# Patient Record
Sex: Female | Born: 1970
Health system: Southern US, Community
[De-identification: ages and names within clinical notes are randomized; demographics above are authoritative.]

## PROBLEM LIST (undated history)

## (undated) DIAGNOSIS — M1990 Unspecified osteoarthritis, unspecified site: Secondary | ICD-10-CM

## (undated) DIAGNOSIS — G5603 Carpal tunnel syndrome, bilateral upper limbs: Secondary | ICD-10-CM

## (undated) DIAGNOSIS — M549 Dorsalgia, unspecified: Secondary | ICD-10-CM

## (undated) DIAGNOSIS — R079 Chest pain, unspecified: Secondary | ICD-10-CM

## (undated) HISTORY — PX: OVARIAN CYST REMOVAL: SHX89

## (undated) HISTORY — PX: APPENDECTOMY: SHX54

## (undated) HISTORY — PX: DENTAL SURGERY: SHX609

## (undated) HISTORY — PX: UPPER GI ENDOSCOPY: SHX6162

## (undated) HISTORY — DX: Chest pain, unspecified: R07.9

## (undated) HISTORY — PX: LASIK: SHX215

---

## 2000-10-24 ENCOUNTER — Encounter: Payer: Self-pay | Admitting: Family Medicine

## 2000-10-24 ENCOUNTER — Encounter: Admission: RE | Admit: 2000-10-24 | Discharge: 2000-10-24 | Payer: Self-pay | Admitting: Family Medicine

## 2003-04-27 ENCOUNTER — Other Ambulatory Visit: Admission: RE | Admit: 2003-04-27 | Discharge: 2003-04-27 | Payer: Self-pay | Admitting: Family Medicine

## 2004-05-02 ENCOUNTER — Other Ambulatory Visit: Admission: RE | Admit: 2004-05-02 | Discharge: 2004-05-02 | Payer: Self-pay | Admitting: Family Medicine

## 2005-05-09 ENCOUNTER — Ambulatory Visit (HOSPITAL_COMMUNITY): Admission: RE | Admit: 2005-05-09 | Discharge: 2005-05-09 | Payer: Self-pay | Admitting: Obstetrics and Gynecology

## 2005-06-01 ENCOUNTER — Inpatient Hospital Stay (HOSPITAL_COMMUNITY): Admission: AD | Admit: 2005-06-01 | Discharge: 2005-06-03 | Payer: Self-pay | Admitting: Obstetrics and Gynecology

## 2007-02-10 ENCOUNTER — Other Ambulatory Visit: Admission: RE | Admit: 2007-02-10 | Discharge: 2007-02-10 | Payer: Self-pay | Admitting: Family Medicine

## 2008-02-11 ENCOUNTER — Other Ambulatory Visit: Admission: RE | Admit: 2008-02-11 | Discharge: 2008-02-11 | Payer: Self-pay | Admitting: Family Medicine

## 2008-02-13 ENCOUNTER — Ambulatory Visit (HOSPITAL_BASED_OUTPATIENT_CLINIC_OR_DEPARTMENT_OTHER): Admission: RE | Admit: 2008-02-13 | Discharge: 2008-02-13 | Payer: Self-pay | Admitting: Family Medicine

## 2008-02-13 ENCOUNTER — Ambulatory Visit: Payer: Self-pay | Admitting: Diagnostic Radiology

## 2010-06-09 NOTE — Op Note (Signed)
Brittany Massey, Brittany Massey                 ACCOUNT NO.:  [id number]   MEDICAL RECORD NO.:  [id number]          PATIENT TYPE:  INP   LOCATION:  9146                          FACILITY:  [facility]   PHYSICIAN:  Lenoard Aden, M.D.DATE OF BIRTH:  11/16/1970   DATE OF PROCEDURE:  06/01/2005  DATE OF DISCHARGE:                                 OPERATIVE REPORT   PREOPERATIVE DIAGNOSES:  1.  A 39 week intrauterine pregnancy.  2.  Estimated fetal weight greater than 4800 g.   POSTOPERATIVE DIAGNOSES:  1.  A 39 week intrauterine pregnancy.  2.  Estimated fetal weight greater than 4800 g.   PROCEDURE:  Primary low segment transverse cesarean section.   SURGEON:  Dr. Billy Coast   ASSISTANT:  Fredric Mare, CNM   ANESTHESIA:  Spinal by Maudry Diego BLOOD LOSS:  1000 mL.   COMPLICATIONS:  None.   DRAINS:  Foley.   COUNTS:  Correct.  The patient in recovery in good condition.   BRIEF OPERATIVE NOTE:  After being apprised of the risks of anesthesia,  infection, bleeding, injury to abdominal organs, need for repair,  __________complications to include bowel or bladder are noted.  The patient  brought to the operating room where she is administered spinal anesthetic  without complications, prepped and draped in the usual sterile fashion,  Foley catheter placed.  After achieving adequate anesthesia, dilute Marcaine  solution placed in the area of a previously made Pfannenstiel skin incision  then made with a scalpel after placement of a dilute Marcaine solution.  The  incision is carried down to the fascia in the midline which is opened  transversely using Mayo scissors.  Rectus muscle is dissected sharply in the  midline, peritoneum entered sharply, bladder blade placed.  Visceroperitoneum is scored sharply in the lower uterine segment.  Kerr  hysterotomy incision made, atraumatic delivery using vacuum system, 1 pull,  for a living female, 9 pounds, 9 ounces, delivered from an occiput anterior  position and handed to pediatricians in attendance.  Apgars 8 and 9, cord  blood collected, placenta delivered manually intact, three-vessel cord  noted.  Uterus exteriorized using a dry lap pack and closed in 2 layers  using a 0 Monocryl suture in a continuous running fashion.  Normal tubes,  normal ovaries noted.  Irrigation is accomplished.  Interrupted sutures  placed along the left lateral portion of the incision to obtain hemostasis.  Good hemostasis is then  achieved, bladder flap inspected and found to be hemostatic.  The fascia  closed using a 0 Monocryl in continuous running fashion.  Skin closed using  staples, 0 plain suture placed in the subcutaneous tissue for approximation  of dead space.  The patient tolerates the procedure well and is transferred  to recovery in good condition.      Lenoard Aden, M.D.  Electronically Signed     RJT/MEDQ  D:  06/01/2005  T:  06/02/2005  Job:  [id number]

## 2010-06-09 NOTE — Discharge Summary (Signed)
NAMEDESHA, BITNER NO.:  [id number]   MEDICAL RECORD NO.:  [id number]          PATIENT TYPE:  INP   LOCATION:  9146                          FACILITY:  [facility]   PHYSICIAN:  Lenoard Aden, M.D.DATE OF BIRTH:  19-Feb-1970   DATE OF ADMISSION:  06/01/2005  DATE OF DISCHARGE:  06/03/2005                                 DISCHARGE SUMMARY   Patient underwent uncomplicated cesarean section, Jun 01, 2005.  Postoperative course uncomplicated.  Hemoglobin and hematocrit within normal  limits.  Discharge teaching done.  Follow-up in the office in four to six  weeks.  Discharge medications will include prenatal vitamins, iron and  Percocet.      Lenoard Aden, M.D.  Electronically Signed     RJT/MEDQ  D:  08/14/2005  T:  08/15/2005  Job:  [id number]

## 2015-12-13 ENCOUNTER — Other Ambulatory Visit (HOSPITAL_BASED_OUTPATIENT_CLINIC_OR_DEPARTMENT_OTHER): Payer: Self-pay | Admitting: Obstetrics and Gynecology

## 2015-12-13 DIAGNOSIS — Z1231 Encounter for screening mammogram for malignant neoplasm of breast: Secondary | ICD-10-CM

## 2015-12-19 ENCOUNTER — Encounter (HOSPITAL_BASED_OUTPATIENT_CLINIC_OR_DEPARTMENT_OTHER): Payer: Self-pay | Admitting: Radiology

## 2015-12-19 ENCOUNTER — Ambulatory Visit (HOSPITAL_BASED_OUTPATIENT_CLINIC_OR_DEPARTMENT_OTHER)
Admission: RE | Admit: 2015-12-19 | Discharge: 2015-12-19 | Disposition: A | Discharge status: Home / Self Care | Payer: BLUE CROSS/BLUE SHIELD | Source: Ambulatory Visit | Attending: Obstetrics and Gynecology | Admitting: Obstetrics and Gynecology

## 2015-12-19 DIAGNOSIS — Z1231 Encounter for screening mammogram for malignant neoplasm of breast: Secondary | ICD-10-CM | POA: Diagnosis not present

## 2015-12-19 DIAGNOSIS — Z1151 Encounter for screening for human papillomavirus (HPV): Secondary | ICD-10-CM | POA: Diagnosis not present

## 2015-12-19 DIAGNOSIS — N924 Excessive bleeding in the premenopausal period: Secondary | ICD-10-CM | POA: Diagnosis not present

## 2015-12-19 DIAGNOSIS — Z01419 Encounter for gynecological examination (general) (routine) without abnormal findings: Secondary | ICD-10-CM | POA: Diagnosis not present

## 2016-06-19 DIAGNOSIS — D2272 Melanocytic nevi of left lower limb, including hip: Secondary | ICD-10-CM | POA: Diagnosis not present

## 2016-06-19 DIAGNOSIS — Z1283 Encounter for screening for malignant neoplasm of skin: Secondary | ICD-10-CM | POA: Diagnosis not present

## 2016-06-19 DIAGNOSIS — L821 Other seborrheic keratosis: Secondary | ICD-10-CM | POA: Diagnosis not present

## 2016-06-19 DIAGNOSIS — D485 Neoplasm of uncertain behavior of skin: Secondary | ICD-10-CM | POA: Diagnosis not present

## 2016-10-24 DIAGNOSIS — D2371 Other benign neoplasm of skin of right lower limb, including hip: Secondary | ICD-10-CM | POA: Diagnosis not present

## 2016-10-24 DIAGNOSIS — D2372 Other benign neoplasm of skin of left lower limb, including hip: Secondary | ICD-10-CM | POA: Diagnosis not present

## 2017-10-08 ENCOUNTER — Telehealth: Payer: Self-pay | Admitting: *Deleted

## 2017-10-08 NOTE — Telephone Encounter (Signed)
Wellness forms completed,faxed,confirmation received and scanned into chart.Brittany Massey, [name]

## 2017-10-15 ENCOUNTER — Encounter (HOSPITAL_BASED_OUTPATIENT_CLINIC_OR_DEPARTMENT_OTHER): Payer: Self-pay | Admitting: Emergency Medicine

## 2017-10-15 ENCOUNTER — Other Ambulatory Visit: Payer: Self-pay

## 2017-10-15 ENCOUNTER — Emergency Department (HOSPITAL_BASED_OUTPATIENT_CLINIC_OR_DEPARTMENT_OTHER): Payer: BLUE CROSS/BLUE SHIELD

## 2017-10-15 ENCOUNTER — Emergency Department (HOSPITAL_BASED_OUTPATIENT_CLINIC_OR_DEPARTMENT_OTHER)
Admission: EM | Admit: 2017-10-15 | Discharge: 2017-10-15 | Disposition: A | Discharge status: Home / Self Care | Payer: BLUE CROSS/BLUE SHIELD | Attending: Emergency Medicine | Admitting: Emergency Medicine

## 2017-10-15 DIAGNOSIS — R079 Chest pain, unspecified: Secondary | ICD-10-CM | POA: Diagnosis not present

## 2017-10-15 DIAGNOSIS — F17210 Nicotine dependence, cigarettes, uncomplicated: Secondary | ICD-10-CM | POA: Diagnosis not present

## 2017-10-15 DIAGNOSIS — R072 Precordial pain: Secondary | ICD-10-CM

## 2017-10-15 LAB — CBC
HEMATOCRIT: 46.3 % — AB (ref 36.0–46.0)
HEMOGLOBIN: 16 g/dL — AB (ref 12.0–15.0)
MCH: 33.2 pg (ref 26.0–34.0)
MCHC: 34.6 g/dL (ref 30.0–36.0)
MCV: 96.1 fL (ref 78.0–100.0)
Platelets: 219 10*3/uL (ref 150–400)
RBC: 4.82 MIL/uL (ref 3.87–5.11)
RDW: 11.6 % (ref 11.5–15.5)
WBC: 7.4 10*3/uL (ref 4.0–10.5)

## 2017-10-15 LAB — BASIC METABOLIC PANEL
ANION GAP: 12 (ref 5–15)
BUN: 12 mg/dL (ref 6–20)
CHLORIDE: 102 mmol/L (ref 98–111)
CO2: 24 mmol/L (ref 22–32)
Calcium: 8.8 mg/dL — ABNORMAL LOW (ref 8.9–10.3)
Creatinine, Ser: 0.67 mg/dL (ref 0.44–1.00)
Glucose, Bld: 118 mg/dL — ABNORMAL HIGH (ref 70–99)
POTASSIUM: 3.5 mmol/L (ref 3.5–5.1)
SODIUM: 138 mmol/L (ref 135–145)

## 2017-10-15 LAB — TROPONIN I
Troponin I: <0.03 ng/mL (ref <0.03)
Troponin I: <0.03 ng/mL (ref <0.03)

## 2017-10-15 LAB — PREGNANCY, URINE: PREG TEST UR: NEGATIVE

## 2017-10-15 MED ORDER — OMEPRAZOLE 20 MG PO CPDR: 20.0000 mg | DELAYED_RELEASE_CAPSULE | Freq: Every day | ORAL | 0 refills | Status: DC

## 2017-10-15 MED FILL — OMEPRAZOLE 20 MG CPDR: 20 | 30 days supply | Qty: 30 | Fill #0

## 2017-10-15 NOTE — ED Provider Notes (Signed)
[facility] Provider Note   CSN: [id number] Arrival date & time: 10/15/17  6063     History   Chief Complaint Chief Complaint  Patient presents with  . Chest Pain    HPI Brittany Massey is a 47 y.o. female.  HPI 47 year old female presents the emergency department with complaints of central chest discomfort that began approximately 530 this morning.  This occurred while working out.  She had a similar episode 2 weeks ago but at that time it was not associated with exertion.  She has had no chest discomfort between the episode 2 weeks ago and the episode today.  She worked out yesterday with vigor and had no difficulty.  She denied chest discomfort with exertion yesterday.  No family history of early cardiac disease.  She denies a history of hypertension, hyperlipidemia, diabetes.  She has a prior history of tobacco abuse but quit 12 years ago.  Patient reports chest discomfort is resolving at this time without intervention through the emergency department.  No unilateral leg swelling.  No pain with deep breathing.  No shortness of breath.  No nausea or vomiting or diaphoresis   History reviewed. No pertinent past medical history.  There are no active problems to display for this patient.   Past Surgical History:  Procedure Laterality Date  . APPENDECTOMY    . CESAREAN SECTION       OB History   None      Home Medications    Prior to Admission medications   Not on File    Family History History reviewed. No pertinent family history.  Social History Social History   Tobacco Use  . Smoking status: Current Every Day Smoker    Packs/day: 1.00    Types: Cigarettes  . Smokeless tobacco: Never Used  Substance Use Topics  . Alcohol use: Yes  . Drug use: Never     Allergies   Penicillins; Sulfa antibiotics; and Wellbutrin [bupropion]   Review of Systems Review of Systems  All other systems reviewed and are  negative.    Physical Exam Updated Vital Signs BP (!) 158/82   Pulse 65   Temp 98.3 F (36.8 C) (Oral)   Resp 14   Ht 5\' 4"  (1.626 m)   Wt 72.6 kg   LMP 09/16/2017   SpO2 100%   BMI 27.46 kg/m   Physical Exam  Constitutional: She is oriented to person, place, and time. She appears well-developed and well-nourished. No distress.  HENT:  Head: Normocephalic and atraumatic.  Eyes: EOM are normal.  Neck: Normal range of motion.  Cardiovascular: Normal rate, regular rhythm and normal heart sounds.  Pulmonary/Chest: Effort normal and breath sounds normal.  Abdominal: Soft. She exhibits no distension. There is no tenderness.  Musculoskeletal: Normal range of motion.  Neurological: She is alert and oriented to person, place, and time.  Skin: Skin is warm and dry.  Psychiatric: She has a normal mood and affect. Judgment normal.  Nursing note and vitals reviewed.    ED Treatments / Results  Labs (all labs ordered are listed, but only abnormal results are displayed) Labs Reviewed  BASIC METABOLIC PANEL - Abnormal; Notable for the following components:      Result Value   Glucose, Bld 118 (*)    Calcium 8.8 (*)    All other components within normal limits  CBC - Abnormal; Notable for the following components:   Hemoglobin 16.0 (*)    HCT 46.3 (*)  All other components within normal limits  TROPONIN I  PREGNANCY, URINE  TROPONIN I    EKG EKG Interpretation #1 Date/Time:  Tuesday October 15 2017 06:44:39 EDT Ventricular Rate:  64 PR Interval:    QRS Duration: 89 QT Interval:  458 QTC Calculation: 473 R Axis:   64 Text Interpretation:  Sinus rhythm Anteroseptal infarct, old Borderline ST depression, anterolateral leads NO STEMI No old tracing to compare Confirmed by Addison Lank [phone]) on 10/15/2017 6:49:38 AM Also confirmed by Addison Lank [phone]), editor Philomena Doheny [phone])  on 10/15/2017 7:31:25 AM    EKG Interpretation #2  Date/Time:  Tuesday October 15 2017 07:32:10 EDT Ventricular Rate:  80 PR Interval:    QRS Duration: 82 QT Interval:  394 QTC Calculation: 455 R Axis:   57 Text Interpretation:  Sinus rhythm Ventricular premature complex Anteroseptal infarct, old No significant change was found Confirmed by Jola Schmidt [phone]) on 10/15/2017 10:25:14 AM         Radiology Dg Chest 2 View  Result Date: 10/15/2017 CLINICAL DATA:  47 year old female with a history of chest pain EXAM: CHEST - 2 VIEW COMPARISON:  None. FINDINGS: The heart size and mediastinal contours are within normal limits. Both lungs are clear. The visualized skeletal structures are unremarkable. IMPRESSION: Negative for acute cardiopulmonary disease Electronically Signed   By: Corrie Mckusick D.O.   On: 10/15/2017 07:36    Procedures Procedures (including critical care time)  Medications Ordered in ED Medications - No data to display   Initial Impression / Assessment and Plan / ED Course  I have reviewed the triage vital signs and the nursing notes.  Pertinent labs & imaging results that were available during my care of the patient were reviewed by me and considered in my medical decision making (see chart for details).     10:25 AM Continues to feel well at this time.  Asymptomatic now.  EKG x2 without ischemic changes.  Troponin negative x2.  Outpatient cardiology and primary care follow-up.  Suspect gastroesophageal reflux disease.  Patient will be started on daily Prilosec.  Patient encouraged to return to the ER for new or worsening symptoms  Final Clinical Impressions(s) / ED Diagnoses   Final diagnoses:  None    ED Discharge Orders    None       Jola Schmidt, MD 10/15/17 1026

## 2017-10-15 NOTE — ED Triage Notes (Signed)
Pt c/o 8/10 central cp that started today at 34 while working out on the gym. Pt have some nausea, denies any SOB or vomiting.

## 2017-10-15 NOTE — ED Notes (Signed)
ED Provider at bedside. 

## 2017-10-15 NOTE — ED Notes (Signed)
Patient transported to X-ray 

## 2017-10-15 NOTE — ED Notes (Signed)
ED Provider at bedside discussing test results and dispo plan of care. 

## 2017-10-22 DIAGNOSIS — Z136 Encounter for screening for cardiovascular disorders: Secondary | ICD-10-CM | POA: Diagnosis not present

## 2017-10-22 DIAGNOSIS — Z0000 Encounter for general adult medical examination without abnormal findings: Secondary | ICD-10-CM | POA: Diagnosis not present

## 2017-11-04 DIAGNOSIS — R079 Chest pain, unspecified: Secondary | ICD-10-CM | POA: Insufficient documentation

## 2017-11-04 HISTORY — DX: Chest pain, unspecified: R07.9

## 2017-11-04 NOTE — Progress Notes (Signed)
Cardiology Office Note:    Date:  11/05/2017   ID:  Brittany Massey, DOB 02/16/1970, MRN [id number]  PCP:  No primary care provider on file.  Cardiologist:  Shirlee More, MD   Referring MD: Jola Schmidt, MD  ASSESSMENT:    1. Chest pain, unspecified type    PLAN:    In order of problems listed above:  1. Her pretest probability of obstructive CAD is low and her symptoms are atypical historically the differential diagnosis here includes gallbladder disease with biliary colic and venous thromboembolism along with CAD.  For further evaluation she will undergo a stress echocardiogram and if normal at a high workload I do not think she requires further ischemic evaluation gallbladder the ultrasound is ordered and if stones are present she will be referred for surgical intervention.  A d-dimer will be done to screen for DVT and if normal no further evaluation she does exercise vigorously including crosstraining she has no chest wall tenderness.  I asked her come back and see me in the office in few weeks to review the results of her studies.  Next appointment 3 weeks   Medication Adjustments/Labs and Tests Ordered: Current medicines are reviewed at length with the patient today.  Concerns regarding medicines are outlined above.  Orders Placed This Encounter  Procedures  . US ABDOMEN LIMITED RUQ  . D-Dimer, Quantitative  . EKG 12-Lead  . ECHOCARDIOGRAM STRESS TEST   No orders of the defined types were placed in this encounter.    Chief Complaint  Patient presents with  . Chest Pain    recent ED visit    History of Present Illness:    Brittany Massey is a 47 y.o. female who is being seen today for the evaluation of chest pain at the request of Jola Schmidt, MD. She was seen at [facility] ED 10/15/17 with normal troponin and non ischemic EKG.  Since the beginning of September she has been having recurrent episodes of chest pain she describes as sharp piercing radiates through to the back  and some right shoulder discomfort a little worse with a deep breath but she is short of breath when she has the episodes.  All these have occurred without physical activity with the exception of one that occurred during exercise and all the episodes have been unrelieved with rest they tend to last for hours at a time.  She has associated nausea with and although she felt as if she would vomit she has not occurred down related to food and she has chronic diarrhea.  Between episodes she exercises and has no intolerance shortness of breath or exertional chest pain she does use the clutch fist to her breast to describe the sensation, a Levine sign.  She has no identifiable cardiovascular risk factors.  She has no known gallbladder disease or esophageal disease but she does have occasional heartburn.  The symptoms are unrelated to meal and do not occur with swallowing.  When the chest pain occurs is very severe in nature no associated diaphoresis radiates through to the back in the right shoulder not to the left arm of the jaw.  She has no background history of congenital rheumatic heart disease the episodes persist and one occurred this morning at rest.  Past Medical History:  Diagnosis Date  . Chest pain 11/04/2017    Past Surgical History:  Procedure Laterality Date  . APPENDECTOMY    . CESAREAN SECTION    . OVARIAN CYST REMOVAL  Current Medications: Current Meds  Medication Sig  . Lactobacillus (PROBIOTIC ACIDOPHILUS PO) Take 1 tablet by mouth daily.  . NON FORMULARY Take 1 tablet by mouth 3 (three) times daily. Digest Gold with ATPRO  . NON FORMULARY Take 1 tablet by mouth 2 (two) times daily. Himalaya Liver Care 375 mg     Allergies:   Bupropion; Penicillins; Sulfamethoxazole; and Tape   Social History   Socioeconomic History  . Marital status: Married    Spouse name: Not on file  . Number of children: Not on file  . Years of education: Not on file  . Highest education level: Not  on file  Occupational History  . Not on file  Social Needs  . Financial resource strain: Not on file  . Food insecurity:    Worry: Not on file    Inability: Not on file  . Transportation needs:    Medical: Not on file    Non-medical: Not on file  Tobacco Use  . Smoking status: Former Smoker    Packs/day: 1.00    Types: Cigarettes    Last attempt to quit: 2007    Years since quitting: 12.7  . Smokeless tobacco: Never Used  Substance and Sexual Activity  . Alcohol use: Yes    Comment: occ wine  . Drug use: Never  . Sexual activity: Not on file  Lifestyle  . Physical activity:    Days per week: Not on file    Minutes per session: Not on file  . Stress: Not on file  Relationships  . Social connections:    Talks on phone: Not on file    Gets together: Not on file    Attends religious service: Not on file    Active member of club or organization: Not on file    Attends meetings of clubs or organizations: Not on file    Relationship status: Not on file  Other Topics Concern  . Not on file  Social History Narrative  . Not on file     Family History: The patient's family history includes Barrett's esophagus in her mother; Bladder Cancer in her father; Hyperlipidemia in her mother; Thyroid disease in her father, maternal grandmother, mother, and sister. There is no history of Heart disease or Heart attack. and VTE  ROS:   Review of Systems  Constitution: Negative.  HENT: Negative.   Eyes: Negative.   Cardiovascular: Positive for chest pain.  Respiratory: Positive for shortness of breath.   Endocrine: Negative.   Hematologic/Lymphatic: Negative.   Skin: Negative.   Musculoskeletal: Negative.   Gastrointestinal: Negative.   Genitourinary: Negative.   Neurological: Negative.   Psychiatric/Behavioral: Negative.    Please see the history of present illness.     All other systems reviewed and are negative.  EKGs/Labs/Other Studies Reviewed:    The following studies  were reviewed today: [facility] ED record, labs, CXR EKG  EKG:  EKG is  ordered today.  The ekg ordered today demonstrates Percy normal  Recent Labs: 10/15/2017: BUN 12; Creatinine, Ser 0.67; Hemoglobin 16.0; Platelets 219; Potassium 3.5; Sodium 138  Recent Lipid Panel No results found for: CHOL, TRIG, HDL, CHOLHDL, VLDL, LDLCALC, LDLDIRECT  Physical Exam:    VS:  BP 130/88 (BP Location: Left Arm, Patient Position: Sitting, Cuff Size: Normal)   Pulse 72   Ht 5\' 4"  (1.626 m)   Wt 160 lb 6.4 oz (72.8 kg)   SpO2 98%   BMI 27.53 kg/m     Wt  Readings from Last 3 Encounters:  11/05/17 160 lb 6.4 oz (72.8 kg)  10/15/17 160 lb (72.6 kg)     GEN:  Well nourished, well developed in no acute distress HEENT: Normal NECK: No JVD; No carotid bruits LYMPHATICS: No lymphadenopathy CARDIAC: RRR, no murmurs, rubs, gallops RESPIRATORY:  Clear to auscultation without rales, wheezing or rhonchi  ABDOMEN: Soft, non-tender, non-distended MUSCULOSKELETAL:  No edema; No deformity  SKIN: Warm and dry NEUROLOGIC:  Alert and oriented x 3 PSYCHIATRIC:  Normal affect     Signed, Shirlee More, MD  11/05/2017 11:41 AM    [place]

## 2017-11-05 ENCOUNTER — Encounter: Payer: Self-pay | Admitting: Cardiology

## 2017-11-05 ENCOUNTER — Ambulatory Visit: Payer: BLUE CROSS/BLUE SHIELD | Admitting: Cardiology

## 2017-11-05 VITALS — BP 130/88 | HR 72 | Ht 64.0 in | Wt 160.4 lb

## 2017-11-05 DIAGNOSIS — R079 Chest pain, unspecified: Secondary | ICD-10-CM | POA: Diagnosis not present

## 2017-11-05 DIAGNOSIS — Z1231 Encounter for screening mammogram for malignant neoplasm of breast: Secondary | ICD-10-CM | POA: Diagnosis not present

## 2017-11-05 DIAGNOSIS — Z1239 Encounter for other screening for malignant neoplasm of breast: Secondary | ICD-10-CM | POA: Diagnosis not present

## 2017-11-05 NOTE — Patient Instructions (Signed)
Medication Instructions:  Your physician recommends that you continue on your current medications as directed. Please refer to the Current Medication list given to you today.  If you need a refill on your cardiac medications before your next appointment, please call your pharmacy.   Lab work: Your physician recommends that you return for lab work today: D-dimer.   If you have labs (blood work) drawn today and your tests are completely normal, you will receive your results only by: Marland Kitchen MyChart Message (if you have MyChart) OR . A paper copy in the mail If you have any lab test that is abnormal or we need to change your treatment, we will call you to review the results.  Testing/Procedures: You had an EKG today.   Your physician has requested that you have a stress echocardiogram. For further information please visit [email or web address]. Please follow instruction sheet as given.  You will be scheduled for a RUQ ultrasound to look at your gallbladder.   Follow-Up: At [facility], you and your health needs are our priority.  As part of our continuing mission to provide you with exceptional heart care, we have created designated Provider Care Teams.  These Care Teams include your primary Cardiologist (physician) and Advanced Practice Providers (APPs -  Physician Assistants and Nurse Practitioners) who all work together to provide you with the care you need, when you need it. You will need a follow up appointment in 3 weeks.       Exercise Stress Electrocardiogram An exercise stress electrocardiogram is a test to check how blood flows to your heart. It is done to find areas of poor blood flow. You will need to walk on a treadmill for this test. The electrocardiogram will record your heartbeat when you are at rest and when you are exercising. What happens before the procedure?  Do not have drinks with caffeine or foods with caffeine for 24 hours before the test, or as told by your doctor.  This includes coffee, tea (even decaf tea), sodas, chocolate, and cocoa.  Follow your doctor's instructions about eating and drinking before the test.  Ask your doctor what medicines you should or should not take before the test. Take your medicines with water unless told by your doctor not to.  If you use an inhaler, bring it with you to the test.  Bring a snack to eat after the test.  Do not  smoke for 4 hours before the test.  Do not put lotions, powders, creams, or oils on your chest before the test.  Wear comfortable shoes and clothing. What happens during the procedure?  You will have patches put on your chest. Small areas of your chest may need to be shaved. Wires will be connected to the patches.  Your heart rate will be watched while you are resting and while you are exercising.  You will walk on the treadmill. The treadmill will slowly get faster to raise your heart rate.  The test will take about 1-2 hours. What happens after the procedure?  Your heart rate and blood pressure will be watched after the test.  You may return to your normal diet, activities, and medicines or as told by your doctor. This information is not intended to replace advice given to you by your health care provider. Make sure you discuss any questions you have with your health care provider. Document Released: 06/27/2007 Document Revised: 09/07/2015 Document Reviewed: 09/15/2012 Elsevier Interactive Patient Education  [employer].  Abdominal or Pelvic Ultrasound An ultrasound is a test that takes pictures of the inside of the body. An abdominal ultrasound takes pictures of your belly. A pelvic ultrasound takes pictures of the area between your belly and thighs. An ultrasound may be done to check an organ or look for problems. If you are pregnant, it may be done to learn about your baby. What happens before the procedure?  Follow instructions from your doctor about what you cannot eat  or drink.  Wear clothing that is easy to wash. Gel from the test might get on your clothes. What happens during the procedure?  A gel will be put on your skin. It may feel cool.  A wand called a transducer will be put on your skin.  The wand will take pictures. They will show on small TV screens. What happens after the procedure?  Get your test results. Ask your doctor or the department that did the test when your results will be ready.  Keep follow-up visits as told by your doctor. This is important. This information is not intended to replace advice given to you by your health care provider. Make sure you discuss any questions you have with your health care provider. Document Released: 02/10/2010 Document Revised: 09/08/2015 Document Reviewed: 10/01/2014 Elsevier Interactive Patient Education  [employer].

## 2017-11-06 LAB — D-DIMER, QUANTITATIVE: D-DIMER: 0.3 mg/L FEU (ref 0.00–0.49)

## 2017-11-07 DIAGNOSIS — N852 Hypertrophy of uterus: Secondary | ICD-10-CM | POA: Diagnosis not present

## 2017-11-07 DIAGNOSIS — Z01419 Encounter for gynecological examination (general) (routine) without abnormal findings: Secondary | ICD-10-CM | POA: Diagnosis not present

## 2017-11-07 DIAGNOSIS — N926 Irregular menstruation, unspecified: Secondary | ICD-10-CM | POA: Diagnosis not present

## 2017-11-12 ENCOUNTER — Ambulatory Visit (HOSPITAL_BASED_OUTPATIENT_CLINIC_OR_DEPARTMENT_OTHER)
Admission: RE | Admit: 2017-11-12 | Discharge: 2017-11-12 | Disposition: A | Discharge status: Home / Self Care | Payer: BLUE CROSS/BLUE SHIELD | Source: Ambulatory Visit | Attending: Cardiology | Admitting: Cardiology

## 2017-11-12 DIAGNOSIS — R1010 Upper abdominal pain, unspecified: Secondary | ICD-10-CM | POA: Diagnosis not present

## 2017-11-12 DIAGNOSIS — R079 Chest pain, unspecified: Secondary | ICD-10-CM | POA: Insufficient documentation

## 2017-11-12 NOTE — Progress Notes (Signed)
  Echocardiogram Echocardiogram Stress Test has been performed.  Jovian Lembcke T Jermond Burkemper 11/12/2017, 10:44 AM

## 2017-11-14 DIAGNOSIS — F419 Anxiety disorder, unspecified: Secondary | ICD-10-CM | POA: Diagnosis not present

## 2017-11-14 DIAGNOSIS — R1013 Epigastric pain: Secondary | ICD-10-CM | POA: Diagnosis not present

## 2017-11-15 ENCOUNTER — Telehealth: Payer: Self-pay | Admitting: *Deleted

## 2017-11-15 DIAGNOSIS — R079 Chest pain, unspecified: Secondary | ICD-10-CM

## 2017-11-15 NOTE — Telephone Encounter (Signed)
Patient informed that Dr. Bettina Gavia recommends a referral to gastroenterology due to continued chest pain episodes. Patient agreeable, referral has been placed. Advised patient to let us know if she has not been contacted to schedule this appointment when she comes to see Dr. Bettina Gavia on 11/28/17. Patient verbalized understanding. No further questions.

## 2017-11-27 DIAGNOSIS — R072 Precordial pain: Secondary | ICD-10-CM | POA: Diagnosis not present

## 2017-11-27 NOTE — Progress Notes (Signed)
Cardiology Office Note:    Date:  11/28/2017   ID:  Brittany Massey, DOB 08/19/1970, MRN [id number]  PCP:  Denzil Magnuson, NP  Cardiologist:  Shirlee More, MD    Referring MD: No ref. provider found    ASSESSMENT:    1. Chest pain, unspecified type   2. PVC (premature ventricular contraction)    PLAN:    In order of problems listed above:  1. Improved she is pending endoscopy for esophageal etiology of chest pain and her stress test was very reassuring normal no evidence of ischemia at a very high workload.  Not require further cardiac evaluation I will plan to see back in the office as needed.  Her gallbladder duplex was normal and if her GI evaluation is unrevealing may benefit from gallbladder function evaluation for chronic cholecystitis 2. She is noted during a stress test no evidence of CAD cardiac dysfunction I offered her reassurance that I do not think there is any reason to consider antiarrhythmic therapy   Next appointment: As needed   Medication Adjustments/Labs and Tests Ordered: Current medicines are reviewed at length with the patient today.  Concerns regarding medicines are outlined above.  No orders of the defined types were placed in this encounter.  No orders of the defined types were placed in this encounter.   Chief Complaint  Patient presents with  . Chest Pain    History of Present Illness:    Brittany Massey is a 47 y.o. female with a hx of chest pain last seen 11/05/17. Compliance with diet, lifestyle and medications: yes  Stress echo and RUQ [place] and D Dimer were normal.  Not having chest pain at this time gallbladder ultrasound unremarkable she is a little bit unsettled and that at the time of her stress test she had PVCs comment was made.  I reviewed the test she had a very high workload of 17 METS it was normal she did not have any high risk markers I do not think there is any reason to consider antiarrhythmic drug therapy or further cardiac  evaluation.  She seems reassured Past Medical History:  Diagnosis Date  . Chest pain 11/04/2017    Past Surgical History:  Procedure Laterality Date  . APPENDECTOMY    . CESAREAN SECTION    . OVARIAN CYST REMOVAL      Current Medications: Current Meds  Medication Sig  . Ascorbic Acid (VITAMIN C) 1000 MG tablet Take 1,000 mg by mouth daily.  . B Complex Vitamins (B COMPLEX-B12 PO) Take by mouth.  . Lactobacillus (PROBIOTIC ACIDOPHILUS PO) Take 1 tablet by mouth daily.  . NON FORMULARY Take 1 tablet by mouth 3 (three) times daily. Digest Gold with ATPRO  . NON FORMULARY Take 1 tablet by mouth 2 (two) times daily. [place] Liver Care 375 mg  . NON FORMULARY Take 1 Scoop by mouth 2 (two) times daily. MSM powder-1 tsp BID  . Omega-3 Fatty Acids (FISH OIL) 1200 MG CAPS Take 1 capsule by mouth daily.  . Turmeric 500 MG CAPS Take 2 capsules by mouth daily.     Allergies:   Bupropion; Penicillins; Sulfamethoxazole; and Tape   Social History   Socioeconomic History  . Marital status: Married    Spouse name: Not on file  . Number of children: Not on file  . Years of education: Not on file  . Highest education level: Not on file  Occupational History  . Not on file  Social Needs  .  Financial resource strain: Not on file  . Food insecurity:    Worry: Not on file    Inability: Not on file  . Transportation needs:    Medical: Not on file    Non-medical: Not on file  Tobacco Use  . Smoking status: Former Smoker    Packs/day: 1.00    Types: Cigarettes    Last attempt to quit: 2007    Years since quitting: 12.8  . Smokeless tobacco: Never Used  Substance and Sexual Activity  . Alcohol use: Yes    Comment: occ wine  . Drug use: Never  . Sexual activity: Not on file  Lifestyle  . Physical activity:    Days per week: Not on file    Minutes per session: Not on file  . Stress: Not on file  Relationships  . Social connections:    Talks on phone: Not on file    Gets together:  Not on file    Attends religious service: Not on file    Active member of club or organization: Not on file    Attends meetings of clubs or organizations: Not on file    Relationship status: Not on file  Other Topics Concern  . Not on file  Social History Narrative  . Not on file     Family History: The patient's family history includes Barrett's esophagus in her mother; Bladder Cancer in her father; Hyperlipidemia in her mother; Thyroid disease in her father, maternal grandmother, mother, and sister. There is no history of Heart disease or Heart attack. ROS:   Please see the history of present illness.    All other systems reviewed and are negative.  EKGs/Labs/Other Studies Reviewed:    The following studies were reviewed today:  Recent Labs: 10/15/2017: BUN 12; Creatinine, Ser 0.67; Hemoglobin 16.0; Platelets 219; Potassium 3.5; Sodium 138  Recent Lipid Panel No results found for: CHOL, TRIG, HDL, CHOLHDL, VLDL, LDLCALC, LDLDIRECT  Physical Exam:    VS:  BP (!) 130/94 (BP Location: Right Arm, Patient Position: Sitting, Cuff Size: Normal)   Pulse 68   Ht 5\' 4"  (1.626 m)   Wt 156 lb 12.8 oz (71.1 kg)   SpO2 98%   BMI 26.91 kg/m     Wt Readings from Last 3 Encounters:  11/28/17 156 lb 12.8 oz (71.1 kg)  11/05/17 160 lb 6.4 oz (72.8 kg)  10/15/17 160 lb (72.6 kg)     GEN:  Well nourished, well developed in no acute distress HEENT: Normal NECK: No JVD; No carotid bruits LYMPHATICS: No lymphadenopathy CARDIAC: RRR, no murmurs, rubs, gallops RESPIRATORY:  Clear to auscultation without rales, wheezing or rhonchi  ABDOMEN: Soft, non-tender, non-distended MUSCULOSKELETAL:  No edema; No deformity  SKIN: Warm and dry NEUROLOGIC:  Alert and oriented x 3 PSYCHIATRIC:  Normal affect    Signed, Shirlee More, MD  11/28/2017 11:52 AM    [place]

## 2017-11-28 ENCOUNTER — Ambulatory Visit: Payer: BLUE CROSS/BLUE SHIELD | Admitting: Cardiology

## 2017-11-28 VITALS — BP 130/94 | HR 68 | Ht 64.0 in | Wt 156.8 lb

## 2017-11-28 DIAGNOSIS — R079 Chest pain, unspecified: Secondary | ICD-10-CM

## 2017-11-28 DIAGNOSIS — I493 Ventricular premature depolarization: Secondary | ICD-10-CM

## 2017-11-28 NOTE — Patient Instructions (Signed)
Medication Instructions:  Your physician recommends that you continue on your current medications as directed. Please refer to the Current Medication list given to you today.  If you need a refill on your cardiac medications before your next appointment, please call your pharmacy.   Lab work: None  If you have labs (blood work) drawn today and your tests are completely normal, you will receive your results only by: . MyChart Message (if you have MyChart) OR . A paper copy in the mail If you have any lab test that is abnormal or we need to change your treatment, we will call you to review the results.  Testing/Procedures: None   Follow-Up: At [facility], you and your health needs are our priority.  As part of our continuing mission to provide you with exceptional heart care, we have created designated Provider Care Teams.  These Care Teams include your primary Cardiologist (physician) and Advanced Practice Providers (APPs -  Physician Assistants and Nurse Practitioners) who all work together to provide you with the care you need, when you need it. You will need a follow up appointment as needed if symptoms worsen or fail to improve.    

## 2017-12-10 DIAGNOSIS — K228 Other specified diseases of esophagus: Secondary | ICD-10-CM | POA: Diagnosis not present

## 2017-12-10 DIAGNOSIS — K2930 Chronic superficial gastritis without bleeding: Secondary | ICD-10-CM | POA: Diagnosis not present

## 2017-12-10 DIAGNOSIS — R1013 Epigastric pain: Secondary | ICD-10-CM | POA: Diagnosis not present

## 2017-12-10 DIAGNOSIS — R112 Nausea with vomiting, unspecified: Secondary | ICD-10-CM | POA: Diagnosis not present

## 2017-12-10 DIAGNOSIS — D130 Benign neoplasm of esophagus: Secondary | ICD-10-CM | POA: Diagnosis not present

## 2017-12-10 DIAGNOSIS — K2970 Gastritis, unspecified, without bleeding: Secondary | ICD-10-CM | POA: Diagnosis not present

## 2017-12-12 ENCOUNTER — Other Ambulatory Visit (HOSPITAL_COMMUNITY): Payer: Self-pay | Admitting: Gastroenterology

## 2017-12-12 DIAGNOSIS — R1013 Epigastric pain: Secondary | ICD-10-CM

## 2017-12-17 DIAGNOSIS — D130 Benign neoplasm of esophagus: Secondary | ICD-10-CM | POA: Diagnosis not present

## 2017-12-30 ENCOUNTER — Ambulatory Visit (HOSPITAL_COMMUNITY)
Admission: RE | Admit: 2017-12-30 | Discharge: 2017-12-30 | Disposition: A | Discharge status: Home / Self Care | Payer: BLUE CROSS/BLUE SHIELD | Source: Ambulatory Visit | Attending: Gastroenterology | Admitting: Gastroenterology

## 2017-12-30 DIAGNOSIS — R1013 Epigastric pain: Secondary | ICD-10-CM

## 2017-12-30 DIAGNOSIS — R112 Nausea with vomiting, unspecified: Secondary | ICD-10-CM | POA: Diagnosis not present

## 2017-12-30 MED ORDER — TECHNETIUM TC 99M MEBROFENIN IV KIT
5.0000 | PACK | Freq: Once | INTRAVENOUS | Status: AC | PRN
  Administered 2017-12-30: 5 via INTRAVENOUS

## 2018-01-27 DIAGNOSIS — R0789 Other chest pain: Secondary | ICD-10-CM | POA: Diagnosis not present

## 2018-01-27 DIAGNOSIS — K219 Gastro-esophageal reflux disease without esophagitis: Secondary | ICD-10-CM | POA: Diagnosis not present

## 2018-02-04 DIAGNOSIS — M549 Dorsalgia, unspecified: Secondary | ICD-10-CM | POA: Diagnosis not present

## 2018-02-06 DIAGNOSIS — M542 Cervicalgia: Secondary | ICD-10-CM | POA: Diagnosis not present

## 2018-02-06 DIAGNOSIS — M9901 Segmental and somatic dysfunction of cervical region: Secondary | ICD-10-CM | POA: Diagnosis not present

## 2018-02-06 DIAGNOSIS — M9902 Segmental and somatic dysfunction of thoracic region: Secondary | ICD-10-CM | POA: Diagnosis not present

## 2018-02-06 DIAGNOSIS — M546 Pain in thoracic spine: Secondary | ICD-10-CM | POA: Diagnosis not present

## 2018-02-18 DIAGNOSIS — M9901 Segmental and somatic dysfunction of cervical region: Secondary | ICD-10-CM | POA: Diagnosis not present

## 2018-02-18 DIAGNOSIS — M9902 Segmental and somatic dysfunction of thoracic region: Secondary | ICD-10-CM | POA: Diagnosis not present

## 2018-02-18 DIAGNOSIS — M542 Cervicalgia: Secondary | ICD-10-CM | POA: Diagnosis not present

## 2018-02-18 DIAGNOSIS — M546 Pain in thoracic spine: Secondary | ICD-10-CM | POA: Diagnosis not present

## 2018-02-20 DIAGNOSIS — M9901 Segmental and somatic dysfunction of cervical region: Secondary | ICD-10-CM | POA: Diagnosis not present

## 2018-02-20 DIAGNOSIS — M546 Pain in thoracic spine: Secondary | ICD-10-CM | POA: Diagnosis not present

## 2018-02-20 DIAGNOSIS — M542 Cervicalgia: Secondary | ICD-10-CM | POA: Diagnosis not present

## 2018-02-20 DIAGNOSIS — M9902 Segmental and somatic dysfunction of thoracic region: Secondary | ICD-10-CM | POA: Diagnosis not present

## 2018-02-24 DIAGNOSIS — R0789 Other chest pain: Secondary | ICD-10-CM | POA: Diagnosis not present

## 2018-02-24 DIAGNOSIS — Z6827 Body mass index (BMI) 27.0-27.9, adult: Secondary | ICD-10-CM | POA: Diagnosis not present

## 2018-02-24 DIAGNOSIS — R030 Elevated blood-pressure reading, without diagnosis of hypertension: Secondary | ICD-10-CM | POA: Diagnosis not present

## 2018-02-25 DIAGNOSIS — M546 Pain in thoracic spine: Secondary | ICD-10-CM | POA: Diagnosis not present

## 2018-02-25 DIAGNOSIS — M9901 Segmental and somatic dysfunction of cervical region: Secondary | ICD-10-CM | POA: Diagnosis not present

## 2018-02-25 DIAGNOSIS — M9902 Segmental and somatic dysfunction of thoracic region: Secondary | ICD-10-CM | POA: Diagnosis not present

## 2018-02-25 DIAGNOSIS — M542 Cervicalgia: Secondary | ICD-10-CM | POA: Diagnosis not present

## 2018-03-04 DIAGNOSIS — M546 Pain in thoracic spine: Secondary | ICD-10-CM | POA: Diagnosis not present

## 2018-03-04 DIAGNOSIS — M9901 Segmental and somatic dysfunction of cervical region: Secondary | ICD-10-CM | POA: Diagnosis not present

## 2018-03-04 DIAGNOSIS — M9902 Segmental and somatic dysfunction of thoracic region: Secondary | ICD-10-CM | POA: Diagnosis not present

## 2018-03-04 DIAGNOSIS — M542 Cervicalgia: Secondary | ICD-10-CM | POA: Diagnosis not present

## 2018-03-12 DIAGNOSIS — M9901 Segmental and somatic dysfunction of cervical region: Secondary | ICD-10-CM | POA: Diagnosis not present

## 2018-03-12 DIAGNOSIS — M9902 Segmental and somatic dysfunction of thoracic region: Secondary | ICD-10-CM | POA: Diagnosis not present

## 2018-03-12 DIAGNOSIS — M546 Pain in thoracic spine: Secondary | ICD-10-CM | POA: Diagnosis not present

## 2018-03-12 DIAGNOSIS — M542 Cervicalgia: Secondary | ICD-10-CM | POA: Diagnosis not present

## 2018-05-16 ENCOUNTER — Ambulatory Visit (INDEPENDENT_AMBULATORY_CARE_PROVIDER_SITE_OTHER): Payer: BLUE CROSS/BLUE SHIELD | Admitting: Orthopaedic Surgery

## 2018-05-16 ENCOUNTER — Ambulatory Visit (INDEPENDENT_AMBULATORY_CARE_PROVIDER_SITE_OTHER): Payer: Self-pay

## 2018-05-16 ENCOUNTER — Encounter (INDEPENDENT_AMBULATORY_CARE_PROVIDER_SITE_OTHER): Payer: Self-pay | Admitting: Orthopaedic Surgery

## 2018-05-16 ENCOUNTER — Other Ambulatory Visit: Payer: Self-pay

## 2018-05-16 DIAGNOSIS — M5412 Radiculopathy, cervical region: Secondary | ICD-10-CM | POA: Insufficient documentation

## 2018-05-16 DIAGNOSIS — M546 Pain in thoracic spine: Secondary | ICD-10-CM | POA: Diagnosis not present

## 2018-05-16 MED ORDER — MELOXICAM 7.5 MG PO TABS: 7.5000 mg | ORAL_TABLET | Freq: Every day | ORAL | 2 refills | Status: DC

## 2018-05-16 NOTE — Progress Notes (Signed)
Office Visit Note   Patient: Brittany Massey           Date of Birth: April 09, 1970           MRN: [id number] Visit Date: 05/16/2018              Requested by: Denzil Magnuson, NP [address] [place], [place] [address] PCP: Denzil Magnuson, NP   Assessment & Plan: Visit Diagnoses:  1. Radiculopathy of cervical spine     Plan: Impression is likely cervical spine radiculopathy into the parascapular region.  At this point, we will start the patient on daily anti-inflammatory and send her to formal physical therapy.  If she is not any better, she will let us know we will get an MRI of her cervical spine to further assess this.  Follow-Up Instructions: Return in about 8 weeks (around 07/11/2018).   Orders:  Orders Placed This Encounter  Procedures  . XR Cervical Spine 2 or 3 views  . XR Thoracic Spine 2 View   Meds ordered this encounter  Medications  . meloxicam (MOBIC) 7.5 MG tablet    Sig: Take 1 tablet (7.5 mg total) by mouth daily.    Dispense:  30 tablet    Refill:  2      Procedures: No procedures performed   Clinical Data: No additional findings.   Subjective: Chief Complaint  Patient presents with  . Middle Back - Pain    HPI patient is a very pleasant and active 48 year old female who presents our clinic today with pain between the shoulder blades.  This started on 09/25/2017.  Started out as sharp chest pain with the radiating pressure to her shoulder blades.  She was seen in the ED where she had a full cardiac work-up.  She has had PVCs, but nothing more.  Negative echo.  Normal follow-up with cardiology.  She has been seen by GI where her gallbladder has been worked up as well as her esophagus.  Both work-ups were completely negative.  The occasional twinge/pressure between the shoulder blades has been occurring approximately once daily.  This does become less frequent when seen by chiropractor.  She is also been seen by massage therapist which does seem to help  at times.  She is unable to find any specific triggers.  She takes Tylenol on a daily basis.  No numbness, tingling or burning to either upper extremity.  No upper extremity weakness.  She does hold a lot of tension in her shoulder blades, however.  She also works out quite a bit.  No previous or current shoulder or neck pain or pathology.  Review of Systems as detailed in HPI.  All others reviewed and are negative.   Objective: Vital Signs: There were no vitals taken for this visit.  Physical Exam well-developed and well-nourished female in no acute distress.  Alert and oriented x3.  Ortho Exam examination of both shoulders are unremarkable.  Full painless range of motion of the neck.  No bony tenderness.  She has slight tenderness between the shoulder blades towards the upper thoracic spine.  No specific trigger points.  Full, painless range of motion of the back.  She is neurovascularly intact distally.  Specialty Comments:  No specialty comments available.  Imaging: Xr Thoracic Spine 2 View  Result Date: 05/16/2018 No acute or structural abnormalities  Xr Cervical Spine 2 Or 3 Views  Result Date: 05/16/2018 Abnormal straightening of the cervical spine with diffuse  degenerative disc disease with anterior spurring    PMFS History: Patient Active Problem List   Diagnosis Date Noted  . Radiculopathy of cervical spine 05/16/2018  . PVC (premature ventricular contraction) 11/28/2017  . Chest pain 11/04/2017  . Abnormal perimenopausal bleeding 12/19/2015   Past Medical History:  Diagnosis Date  . Chest pain 11/04/2017    Family History  Problem Relation Age of Onset  . Hyperlipidemia Mother   . Barrett's esophagus Mother   . Thyroid disease Mother   . Thyroid disease Father   . Bladder Cancer Father   . Thyroid disease Sister   . Thyroid disease Maternal Grandmother   . Heart disease Neg Hx   . Heart attack Neg Hx     Past Surgical History:  Procedure Laterality Date   . APPENDECTOMY    . CESAREAN SECTION    . OVARIAN CYST REMOVAL     Social History   Occupational History  . Not on file  Tobacco Use  . Smoking status: Former Smoker    Packs/day: 1.00    Types: Cigarettes    Last attempt to quit: 2007    Years since quitting: 13.3  . Smokeless tobacco: Never Used  Substance and Sexual Activity  . Alcohol use: Yes    Comment: occ wine  . Drug use: Never  . Sexual activity: Not on file

## 2018-05-20 DIAGNOSIS — M546 Pain in thoracic spine: Secondary | ICD-10-CM | POA: Diagnosis not present

## 2018-05-20 DIAGNOSIS — R293 Abnormal posture: Secondary | ICD-10-CM | POA: Diagnosis not present

## 2018-05-20 DIAGNOSIS — M799 Soft tissue disorder, unspecified: Secondary | ICD-10-CM | POA: Diagnosis not present

## 2018-05-20 DIAGNOSIS — M542 Cervicalgia: Secondary | ICD-10-CM | POA: Diagnosis not present

## 2018-05-22 DIAGNOSIS — R293 Abnormal posture: Secondary | ICD-10-CM | POA: Diagnosis not present

## 2018-05-22 DIAGNOSIS — M799 Soft tissue disorder, unspecified: Secondary | ICD-10-CM | POA: Diagnosis not present

## 2018-05-22 DIAGNOSIS — M546 Pain in thoracic spine: Secondary | ICD-10-CM | POA: Diagnosis not present

## 2018-05-22 DIAGNOSIS — M542 Cervicalgia: Secondary | ICD-10-CM | POA: Diagnosis not present

## 2018-05-27 DIAGNOSIS — M799 Soft tissue disorder, unspecified: Secondary | ICD-10-CM | POA: Diagnosis not present

## 2018-05-27 DIAGNOSIS — M542 Cervicalgia: Secondary | ICD-10-CM | POA: Diagnosis not present

## 2018-05-27 DIAGNOSIS — M546 Pain in thoracic spine: Secondary | ICD-10-CM | POA: Diagnosis not present

## 2018-05-27 DIAGNOSIS — R293 Abnormal posture: Secondary | ICD-10-CM | POA: Diagnosis not present

## 2018-05-30 DIAGNOSIS — M546 Pain in thoracic spine: Secondary | ICD-10-CM | POA: Diagnosis not present

## 2018-05-30 DIAGNOSIS — M799 Soft tissue disorder, unspecified: Secondary | ICD-10-CM | POA: Diagnosis not present

## 2018-05-30 DIAGNOSIS — M542 Cervicalgia: Secondary | ICD-10-CM | POA: Diagnosis not present

## 2018-05-30 DIAGNOSIS — R293 Abnormal posture: Secondary | ICD-10-CM | POA: Diagnosis not present

## 2018-06-03 DIAGNOSIS — M799 Soft tissue disorder, unspecified: Secondary | ICD-10-CM | POA: Diagnosis not present

## 2018-06-03 DIAGNOSIS — R293 Abnormal posture: Secondary | ICD-10-CM | POA: Diagnosis not present

## 2018-06-03 DIAGNOSIS — M546 Pain in thoracic spine: Secondary | ICD-10-CM | POA: Diagnosis not present

## 2018-06-03 DIAGNOSIS — M542 Cervicalgia: Secondary | ICD-10-CM | POA: Diagnosis not present

## 2018-06-06 DIAGNOSIS — M542 Cervicalgia: Secondary | ICD-10-CM | POA: Diagnosis not present

## 2018-06-06 DIAGNOSIS — M546 Pain in thoracic spine: Secondary | ICD-10-CM | POA: Diagnosis not present

## 2018-06-06 DIAGNOSIS — R293 Abnormal posture: Secondary | ICD-10-CM | POA: Diagnosis not present

## 2018-06-06 DIAGNOSIS — M799 Soft tissue disorder, unspecified: Secondary | ICD-10-CM | POA: Diagnosis not present

## 2018-06-10 DIAGNOSIS — M542 Cervicalgia: Secondary | ICD-10-CM | POA: Diagnosis not present

## 2018-06-10 DIAGNOSIS — R293 Abnormal posture: Secondary | ICD-10-CM | POA: Diagnosis not present

## 2018-06-10 DIAGNOSIS — M799 Soft tissue disorder, unspecified: Secondary | ICD-10-CM | POA: Diagnosis not present

## 2018-06-10 DIAGNOSIS — M546 Pain in thoracic spine: Secondary | ICD-10-CM | POA: Diagnosis not present

## 2018-06-13 DIAGNOSIS — R293 Abnormal posture: Secondary | ICD-10-CM | POA: Diagnosis not present

## 2018-06-13 DIAGNOSIS — M799 Soft tissue disorder, unspecified: Secondary | ICD-10-CM | POA: Diagnosis not present

## 2018-06-13 DIAGNOSIS — M546 Pain in thoracic spine: Secondary | ICD-10-CM | POA: Diagnosis not present

## 2018-06-13 DIAGNOSIS — M542 Cervicalgia: Secondary | ICD-10-CM | POA: Diagnosis not present

## 2018-06-17 DIAGNOSIS — M546 Pain in thoracic spine: Secondary | ICD-10-CM | POA: Diagnosis not present

## 2018-06-17 DIAGNOSIS — M799 Soft tissue disorder, unspecified: Secondary | ICD-10-CM | POA: Diagnosis not present

## 2018-06-17 DIAGNOSIS — M542 Cervicalgia: Secondary | ICD-10-CM | POA: Diagnosis not present

## 2018-06-17 DIAGNOSIS — R293 Abnormal posture: Secondary | ICD-10-CM | POA: Diagnosis not present

## 2018-06-20 DIAGNOSIS — M546 Pain in thoracic spine: Secondary | ICD-10-CM | POA: Diagnosis not present

## 2018-06-20 DIAGNOSIS — R293 Abnormal posture: Secondary | ICD-10-CM | POA: Diagnosis not present

## 2018-06-20 DIAGNOSIS — M799 Soft tissue disorder, unspecified: Secondary | ICD-10-CM | POA: Diagnosis not present

## 2018-06-20 DIAGNOSIS — M542 Cervicalgia: Secondary | ICD-10-CM | POA: Diagnosis not present

## 2018-06-23 DIAGNOSIS — N951 Menopausal and female climacteric states: Secondary | ICD-10-CM | POA: Diagnosis not present

## 2018-06-23 DIAGNOSIS — G8929 Other chronic pain: Secondary | ICD-10-CM | POA: Diagnosis not present

## 2018-06-23 DIAGNOSIS — M546 Pain in thoracic spine: Secondary | ICD-10-CM | POA: Diagnosis not present

## 2018-06-23 DIAGNOSIS — R079 Chest pain, unspecified: Secondary | ICD-10-CM | POA: Diagnosis not present

## 2018-06-23 DIAGNOSIS — N926 Irregular menstruation, unspecified: Secondary | ICD-10-CM | POA: Diagnosis not present

## 2018-06-24 DIAGNOSIS — M542 Cervicalgia: Secondary | ICD-10-CM | POA: Diagnosis not present

## 2018-06-24 DIAGNOSIS — M546 Pain in thoracic spine: Secondary | ICD-10-CM | POA: Diagnosis not present

## 2018-06-24 DIAGNOSIS — R293 Abnormal posture: Secondary | ICD-10-CM | POA: Diagnosis not present

## 2018-06-24 DIAGNOSIS — M799 Soft tissue disorder, unspecified: Secondary | ICD-10-CM | POA: Diagnosis not present

## 2018-06-27 DIAGNOSIS — M546 Pain in thoracic spine: Secondary | ICD-10-CM | POA: Diagnosis not present

## 2018-06-27 DIAGNOSIS — M542 Cervicalgia: Secondary | ICD-10-CM | POA: Diagnosis not present

## 2018-06-27 DIAGNOSIS — R293 Abnormal posture: Secondary | ICD-10-CM | POA: Diagnosis not present

## 2018-06-27 DIAGNOSIS — M799 Soft tissue disorder, unspecified: Secondary | ICD-10-CM | POA: Diagnosis not present

## 2018-07-01 DIAGNOSIS — M542 Cervicalgia: Secondary | ICD-10-CM | POA: Diagnosis not present

## 2018-07-01 DIAGNOSIS — M546 Pain in thoracic spine: Secondary | ICD-10-CM | POA: Diagnosis not present

## 2018-07-01 DIAGNOSIS — R293 Abnormal posture: Secondary | ICD-10-CM | POA: Diagnosis not present

## 2018-07-01 DIAGNOSIS — M799 Soft tissue disorder, unspecified: Secondary | ICD-10-CM | POA: Diagnosis not present

## 2018-07-04 DIAGNOSIS — M799 Soft tissue disorder, unspecified: Secondary | ICD-10-CM | POA: Diagnosis not present

## 2018-07-04 DIAGNOSIS — M546 Pain in thoracic spine: Secondary | ICD-10-CM | POA: Diagnosis not present

## 2018-07-04 DIAGNOSIS — M542 Cervicalgia: Secondary | ICD-10-CM | POA: Diagnosis not present

## 2018-07-04 DIAGNOSIS — R293 Abnormal posture: Secondary | ICD-10-CM | POA: Diagnosis not present

## 2018-07-08 DIAGNOSIS — M546 Pain in thoracic spine: Secondary | ICD-10-CM | POA: Diagnosis not present

## 2018-07-08 DIAGNOSIS — M542 Cervicalgia: Secondary | ICD-10-CM | POA: Diagnosis not present

## 2018-07-08 DIAGNOSIS — M799 Soft tissue disorder, unspecified: Secondary | ICD-10-CM | POA: Diagnosis not present

## 2018-07-08 DIAGNOSIS — R293 Abnormal posture: Secondary | ICD-10-CM | POA: Diagnosis not present

## 2018-07-11 DIAGNOSIS — M542 Cervicalgia: Secondary | ICD-10-CM | POA: Diagnosis not present

## 2018-07-11 DIAGNOSIS — M546 Pain in thoracic spine: Secondary | ICD-10-CM | POA: Diagnosis not present

## 2018-07-11 DIAGNOSIS — R293 Abnormal posture: Secondary | ICD-10-CM | POA: Diagnosis not present

## 2018-07-11 DIAGNOSIS — M799 Soft tissue disorder, unspecified: Secondary | ICD-10-CM | POA: Diagnosis not present

## 2018-07-17 ENCOUNTER — Ambulatory Visit: Payer: Self-pay | Admitting: Orthopaedic Surgery

## 2018-07-22 DIAGNOSIS — M542 Cervicalgia: Secondary | ICD-10-CM | POA: Diagnosis not present

## 2018-07-22 DIAGNOSIS — M546 Pain in thoracic spine: Secondary | ICD-10-CM | POA: Diagnosis not present

## 2018-07-22 DIAGNOSIS — R293 Abnormal posture: Secondary | ICD-10-CM | POA: Diagnosis not present

## 2018-07-22 DIAGNOSIS — M799 Soft tissue disorder, unspecified: Secondary | ICD-10-CM | POA: Diagnosis not present

## 2018-07-23 ENCOUNTER — Other Ambulatory Visit: Payer: Self-pay

## 2018-07-23 ENCOUNTER — Encounter: Payer: Self-pay | Admitting: Orthopaedic Surgery

## 2018-07-23 ENCOUNTER — Ambulatory Visit (INDEPENDENT_AMBULATORY_CARE_PROVIDER_SITE_OTHER): Payer: BC Managed Care – PPO | Admitting: Orthopaedic Surgery

## 2018-07-23 DIAGNOSIS — M542 Cervicalgia: Secondary | ICD-10-CM | POA: Diagnosis not present

## 2018-07-23 NOTE — Progress Notes (Signed)
Office Visit Note   Patient: Brittany Massey           Date of Birth: 1971-01-19           MRN: [id number] Visit Date: 07/23/2018              Requested by: Denzil Magnuson, NP [address] [place],  [place] [address] PCP: Denzil Magnuson, NP   Assessment & Plan: Visit Diagnoses:  1. Cervicalgia     Plan: At this point, we will obtain an MRI of the cervical spine to further assess for structural abnormalities.  She will follow-up with [place] once that has been completed.  Call with concerns or questions in the meantime.  Follow-Up Instructions: Return in about 2 weeks (around 08/06/2018).   Orders:  No orders of the defined types were placed in this encounter.  No orders of the defined types were placed in this encounter.     Procedures: No procedures performed   Clinical Data: No additional findings.   Subjective: Chief Complaint  Patient presents with  . Neck - Follow-up    HPI patient is a pleasant 48 year old female who presents to our clinic today for follow-up of her neck pain.  Initial pain started in September 2019.  Pain started out between her shoulder blades radiating into the center of her chest.  She was initially seen in the ED and then followed up with cardiology.  She has had full cardiology work-up at this point.  She states that everything was normal other than some infrequent PVCs.  She is also been seen and worked up by GI.  Everything normal there.  She has been in formal physical therapy as well as taking oral anti-inflammatories and tried numerous types of pillows for the past several months.  She has had minimal improvement of "episodes ".  No previous cervical spine MRI to date.  Review of Systems as detailed in HPI.  All others reviewed and are negative.   Objective: Vital Signs: Ht 5\' 4"  (1.626 m)   Wt 156 lb 12.8 oz (71.1 kg)   BMI 26.91 kg/m   Physical Exam well-developed well-nourished female no acute distress.  Alert and oriented x3.   Ortho Exam stable cervical spine exam  Specialty Comments:  No specialty comments available.  Imaging: No new imaging   PMFS History: Patient Active Problem List   Diagnosis Date Noted  . Radiculopathy of cervical spine 05/16/2018  . PVC (premature ventricular contraction) 11/28/2017  . Chest pain 11/04/2017  . Abnormal perimenopausal bleeding 12/19/2015   Past Medical History:  Diagnosis Date  . Chest pain 11/04/2017    Family History  Problem Relation Age of Onset  . Hyperlipidemia Mother   . Barrett's esophagus Mother   . Thyroid disease Mother   . Thyroid disease Father   . Bladder Cancer Father   . Thyroid disease Sister   . Thyroid disease Maternal Grandmother   . Heart disease Neg Hx   . Heart attack Neg Hx     Past Surgical History:  Procedure Laterality Date  . APPENDECTOMY    . CESAREAN SECTION    . OVARIAN CYST REMOVAL     Social History   Occupational History  . Not on file  Tobacco Use  . Smoking status: Former Smoker    Packs/day: 1.00    Types: Cigarettes    Quit date: 2007    Years since quitting: 13.5  . Smokeless tobacco: Never  Used  Substance and Sexual Activity  . Alcohol use: Yes    Comment: occ wine  . Drug use: Never  . Sexual activity: Not on file

## 2018-07-24 DIAGNOSIS — M542 Cervicalgia: Secondary | ICD-10-CM | POA: Diagnosis not present

## 2018-07-24 DIAGNOSIS — M546 Pain in thoracic spine: Secondary | ICD-10-CM | POA: Diagnosis not present

## 2018-07-24 DIAGNOSIS — R293 Abnormal posture: Secondary | ICD-10-CM | POA: Diagnosis not present

## 2018-07-24 DIAGNOSIS — M799 Soft tissue disorder, unspecified: Secondary | ICD-10-CM | POA: Diagnosis not present

## 2018-07-29 ENCOUNTER — Telehealth: Payer: Self-pay | Admitting: Orthopaedic Surgery

## 2018-07-29 DIAGNOSIS — R293 Abnormal posture: Secondary | ICD-10-CM | POA: Diagnosis not present

## 2018-07-29 DIAGNOSIS — M799 Soft tissue disorder, unspecified: Secondary | ICD-10-CM | POA: Diagnosis not present

## 2018-07-29 DIAGNOSIS — M546 Pain in thoracic spine: Secondary | ICD-10-CM | POA: Diagnosis not present

## 2018-07-29 DIAGNOSIS — M542 Cervicalgia: Secondary | ICD-10-CM | POA: Diagnosis not present

## 2018-07-29 NOTE — Telephone Encounter (Signed)
Stacy Bennett/PT request call back with the status of the MRI . Marzetta Board callback # [phone]

## 2018-07-29 NOTE — Telephone Encounter (Signed)
MRI c spine ordered and requesting status of order?

## 2018-07-29 NOTE — Telephone Encounter (Signed)
IC them and advised them to call [facility] to sched.  Auth done.

## 2018-07-29 NOTE — Telephone Encounter (Signed)
Lennie Hummer, PT would like to discuss some of the symptoms patient told her about today, please call her. [phone] Can you call her?  Patient is calling to sched MRI now.

## 2018-07-29 NOTE — Telephone Encounter (Signed)
Can we add thoracic MRI to c-spine mri order?

## 2018-07-29 NOTE — Telephone Encounter (Signed)
Working on this one currently.

## 2018-07-30 ENCOUNTER — Other Ambulatory Visit: Payer: Self-pay | Admitting: Orthopaedic Surgery

## 2018-07-30 DIAGNOSIS — G8929 Other chronic pain: Secondary | ICD-10-CM

## 2018-07-30 NOTE — Telephone Encounter (Signed)
Can you make note in this message of why Thoracic MRI is requested?  I have to submit clinicals for review for auth of this.  Thanks-

## 2018-07-30 NOTE — Telephone Encounter (Signed)
Sure.  No problem.

## 2018-07-30 NOTE — Telephone Encounter (Signed)
Thanks

## 2018-07-31 NOTE — Telephone Encounter (Signed)
Ok, the insurance is just asking for clinicals.  I can call the PT back and ask her to send me her note, and I will send that in.

## 2018-07-31 NOTE — Telephone Encounter (Signed)
ok 

## 2018-07-31 NOTE — Telephone Encounter (Signed)
I have called PT at number below several times today, call cannot be completed as dialed, cannot LM

## 2018-07-31 NOTE — Telephone Encounter (Signed)
Physical therapist thought it was necessary.  If not approved, we can just get c-spine

## 2018-08-01 DIAGNOSIS — R293 Abnormal posture: Secondary | ICD-10-CM | POA: Diagnosis not present

## 2018-08-01 DIAGNOSIS — M542 Cervicalgia: Secondary | ICD-10-CM | POA: Diagnosis not present

## 2018-08-01 DIAGNOSIS — M546 Pain in thoracic spine: Secondary | ICD-10-CM | POA: Diagnosis not present

## 2018-08-01 DIAGNOSIS — M799 Soft tissue disorder, unspecified: Secondary | ICD-10-CM | POA: Diagnosis not present

## 2018-08-01 NOTE — Telephone Encounter (Signed)
I tried to call PT again and got a fast busy, unable to reach therapist still.

## 2018-08-05 DIAGNOSIS — M542 Cervicalgia: Secondary | ICD-10-CM | POA: Diagnosis not present

## 2018-08-05 DIAGNOSIS — M546 Pain in thoracic spine: Secondary | ICD-10-CM | POA: Diagnosis not present

## 2018-08-05 DIAGNOSIS — M799 Soft tissue disorder, unspecified: Secondary | ICD-10-CM | POA: Diagnosis not present

## 2018-08-05 DIAGNOSIS — R293 Abnormal posture: Secondary | ICD-10-CM | POA: Diagnosis not present

## 2018-08-07 DIAGNOSIS — M542 Cervicalgia: Secondary | ICD-10-CM | POA: Diagnosis not present

## 2018-08-07 DIAGNOSIS — R293 Abnormal posture: Secondary | ICD-10-CM | POA: Diagnosis not present

## 2018-08-07 DIAGNOSIS — M799 Soft tissue disorder, unspecified: Secondary | ICD-10-CM | POA: Diagnosis not present

## 2018-08-07 DIAGNOSIS — M546 Pain in thoracic spine: Secondary | ICD-10-CM | POA: Diagnosis not present

## 2018-08-12 DIAGNOSIS — M546 Pain in thoracic spine: Secondary | ICD-10-CM | POA: Diagnosis not present

## 2018-08-12 DIAGNOSIS — M799 Soft tissue disorder, unspecified: Secondary | ICD-10-CM | POA: Diagnosis not present

## 2018-08-12 DIAGNOSIS — R293 Abnormal posture: Secondary | ICD-10-CM | POA: Diagnosis not present

## 2018-08-12 DIAGNOSIS — M542 Cervicalgia: Secondary | ICD-10-CM | POA: Diagnosis not present

## 2018-08-15 DIAGNOSIS — M546 Pain in thoracic spine: Secondary | ICD-10-CM | POA: Diagnosis not present

## 2018-08-15 DIAGNOSIS — M542 Cervicalgia: Secondary | ICD-10-CM | POA: Diagnosis not present

## 2018-08-15 DIAGNOSIS — R293 Abnormal posture: Secondary | ICD-10-CM | POA: Diagnosis not present

## 2018-08-15 DIAGNOSIS — M799 Soft tissue disorder, unspecified: Secondary | ICD-10-CM | POA: Diagnosis not present

## 2018-08-21 ENCOUNTER — Other Ambulatory Visit: Payer: Self-pay

## 2018-08-21 ENCOUNTER — Ambulatory Visit
Admission: RE | Admit: 2018-08-21 | Discharge: 2018-08-21 | Disposition: A | Discharge status: Home / Self Care | Payer: BC Managed Care – PPO | Source: Ambulatory Visit | Attending: Orthopaedic Surgery | Admitting: Orthopaedic Surgery

## 2018-08-21 ENCOUNTER — Ambulatory Visit
Admission: RE | Admit: 2018-08-21 | Discharge: 2018-08-21 | Disposition: A | Discharge status: Home / Self Care | Payer: BC Managed Care – PPO | Source: Ambulatory Visit | Attending: Physician Assistant | Admitting: Physician Assistant

## 2018-08-21 DIAGNOSIS — M542 Cervicalgia: Secondary | ICD-10-CM

## 2018-08-21 DIAGNOSIS — G8929 Other chronic pain: Secondary | ICD-10-CM

## 2018-08-22 ENCOUNTER — Encounter: Payer: Self-pay | Admitting: Orthopaedic Surgery

## 2018-08-22 ENCOUNTER — Other Ambulatory Visit: Payer: Self-pay | Admitting: Radiology

## 2018-08-22 DIAGNOSIS — M5412 Radiculopathy, cervical region: Secondary | ICD-10-CM

## 2018-08-22 NOTE — Progress Notes (Signed)
Fu to discuss mri

## 2018-08-22 NOTE — Telephone Encounter (Signed)
Discussed MRI findings. Will schedule for ESI with [place].

## 2018-08-22 NOTE — Telephone Encounter (Signed)
C spine.   Patience Musca already set it up.

## 2018-08-25 ENCOUNTER — Telehealth: Payer: Self-pay | Admitting: *Deleted

## 2018-08-25 NOTE — Telephone Encounter (Signed)
Pt had an OV with Dr. Erlinda Hong and states a 7 out of 10 pain.

## 2018-08-26 ENCOUNTER — Other Ambulatory Visit: Payer: Self-pay

## 2018-08-26 ENCOUNTER — Encounter: Payer: Self-pay | Admitting: Orthopaedic Surgery

## 2018-08-26 ENCOUNTER — Ambulatory Visit (INDEPENDENT_AMBULATORY_CARE_PROVIDER_SITE_OTHER): Payer: BC Managed Care – PPO | Admitting: Orthopaedic Surgery

## 2018-08-26 DIAGNOSIS — M5412 Radiculopathy, cervical region: Secondary | ICD-10-CM

## 2018-08-26 NOTE — Progress Notes (Signed)
      Patient: Brittany Massey           Date of Birth: 08-Mar-1970           MRN: [id number] Visit Date: 08/26/2018 PCP: Denzil Magnuson, NP   Assessment & Plan:  Chief Complaint:  Chief Complaint  Patient presents with  . Neck - Follow-up  . Middle Back - Follow-up   Visit Diagnoses:  1. Radiculopathy of cervical spine     Plan: Evelynn comes in today to further discuss MRI results of her cervical spine and thoracic spine which were previously discussed over the phone.  Cervical spine MRI shows a small disc bulge at C5-6 with mild right foraminal narrowing.  She also has a small disc bulge at C6-7.  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  We have referred the patient to Dr. Ernestina Patches for [facility].  She will follow-up with [place] as needed.  Follow-Up Instructions: Return if symptoms worsen or fail to improve.   Orders:  No orders of the defined types were placed in this encounter.  No orders of the defined types were placed in this encounter.   Imaging: No new imaging  PMFS History: Patient Active Problem List   Diagnosis Date Noted  . Radiculopathy of cervical spine 05/16/2018  . PVC (premature ventricular contraction) 11/28/2017  . Chest pain 11/04/2017  . Abnormal perimenopausal bleeding 12/19/2015   Past Medical History:  Diagnosis Date  . Chest pain 11/04/2017    Family History  Problem Relation Age of Onset  . Hyperlipidemia Mother   . Barrett's esophagus Mother   . Thyroid disease Mother   . Thyroid disease Father   . Bladder Cancer Father   . Thyroid disease Sister   . Thyroid disease Maternal Grandmother   . Heart disease Neg Hx   . Heart attack Neg Hx     Past Surgical History:  Procedure Laterality Date  . APPENDECTOMY    . CESAREAN SECTION    . OVARIAN CYST REMOVAL     Social History   Occupational History  . Not on file  Tobacco Use  . Smoking status: Former Smoker    Packs/day: 1.00    Types: Cigarettes    Quit  date: 2007    Years since quitting: 13.6  . Smokeless tobacco: Never Used  Substance and Sexual Activity  . Alcohol use: Yes    Comment: occ wine  . Drug use: Never  . Sexual activity: Not on file

## 2018-08-27 ENCOUNTER — Encounter: Payer: Self-pay | Admitting: Orthopaedic Surgery

## 2018-08-27 NOTE — Telephone Encounter (Signed)
What can we do?  Peer to peer?

## 2018-08-27 NOTE — Telephone Encounter (Signed)
I couldn't reply to her for some reason.  Can you send this to her please.  Thanks.  Hi Blen, I got your injection approved by your insurance so we will call you soon to get it scheduled.  In terms of the retrolisthesis, it appears that it's chronic which usually does not cause a problem or pain.

## 2018-08-27 NOTE — Telephone Encounter (Signed)
Do we need to call them or they will call us?

## 2018-08-27 NOTE — Telephone Encounter (Signed)
Peer to peer done by Dr. Erlinda Hong with Dr. Grandville Silos.  Authorization for Cervical ESI through 09/24/2018. Authorization number is [id number].

## 2018-09-03 ENCOUNTER — Telehealth: Payer: Self-pay | Admitting: Physical Medicine and Rehabilitation

## 2018-09-04 ENCOUNTER — Other Ambulatory Visit: Payer: Self-pay | Admitting: Physical Medicine and Rehabilitation

## 2018-09-04 DIAGNOSIS — F411 Generalized anxiety disorder: Secondary | ICD-10-CM

## 2018-09-04 MED ORDER — DIAZEPAM 5 MG PO TABS: ORAL_TABLET | ORAL | 0 refills | Status: DC

## 2018-09-04 NOTE — Progress Notes (Signed)
Pre-procedure diazepam ordered for pre-operative anxiety.  

## 2018-09-11 DIAGNOSIS — M546 Pain in thoracic spine: Secondary | ICD-10-CM | POA: Diagnosis not present

## 2018-09-11 DIAGNOSIS — M799 Soft tissue disorder, unspecified: Secondary | ICD-10-CM | POA: Diagnosis not present

## 2018-09-11 DIAGNOSIS — M542 Cervicalgia: Secondary | ICD-10-CM | POA: Diagnosis not present

## 2018-09-11 DIAGNOSIS — R293 Abnormal posture: Secondary | ICD-10-CM | POA: Diagnosis not present

## 2018-09-18 ENCOUNTER — Encounter: Payer: Self-pay | Admitting: Physical Medicine and Rehabilitation

## 2018-09-18 ENCOUNTER — Ambulatory Visit (INDEPENDENT_AMBULATORY_CARE_PROVIDER_SITE_OTHER): Payer: BC Managed Care – PPO | Admitting: Physical Medicine and Rehabilitation

## 2018-09-18 ENCOUNTER — Ambulatory Visit: Payer: Self-pay

## 2018-09-18 VITALS — BP 147/100 | HR 83

## 2018-09-18 DIAGNOSIS — M5412 Radiculopathy, cervical region: Secondary | ICD-10-CM

## 2018-09-18 MED ORDER — METHYLPREDNISOLONE ACETATE 80 MG/ML IJ SUSP
80.0000 mg | Freq: Once | INTRAMUSCULAR | Status: AC
  Administered 2018-09-18: 80 mg

## 2018-09-18 NOTE — Progress Notes (Signed)
   Numeric Pain Rating Scale and Functional Assessment Average Pain 10   In the last MONTH (on 0-10 scale) has pain interfered with the following?  1. General activity like being  able to carry out your everyday physical activities such as walking, climbing stairs, carrying groceries, or moving a chair?  Rating(8)   +Driver, -BT, -Dye Allergies.  

## 2018-09-23 NOTE — Procedures (Signed)
Cervical Epidural Steroid Injection - Interlaminar Approach with Fluoroscopic Guidance  Patient: Brittany Massey      Date of Birth: [birth year] MRN: [id number] PCP: Denzil Magnuson, NP      Visit Date: 09/18/2018   Universal Protocol:    Date/Time: 09/22/2008:44 AM  Consent Given By: the patient  Position: PRONE  Additional Comments: Vital signs were monitored before and after the procedure. Patient was prepped and draped in the usual sterile fashion. The correct patient, procedure, and site was verified.   Injection Procedure Details:  Procedure Site One Meds Administered:  Meds ordered this encounter  Medications  . methylPREDNISolone acetate (DEPO-MEDROL) injection 80 mg     Laterality: Left  Location/Site: C7-T1  Needle size: 20 G  Needle type: Touhy  Needle Placement: Paramedian epidural space  Findings:  -Comments: Excellent flow of contrast along the nerve and into the epidural space.  Procedure Details: Using a paramedian approach from the side mentioned above, the region overlying the inferior lamina was localized under fluoroscopic visualization and the soft tissues overlying this structure were infiltrated with 4 ml. of 1% Lidocaine without Epinephrine. A # 20 gauge, Tuohy needle was inserted into the epidural space using a paramedian approach.  The epidural space was localized using loss of resistance along with lateral and contralateral oblique bi-planar fluoroscopic views.  After negative aspirate for air, blood, and CSF, a 2 ml. volume of Isovue-250 was injected into the epidural space and the flow of contrast was observed. Radiographs were obtained for documentation purposes.   The injectate was administered into the level noted above.  Additional Comments:  The patient tolerated the procedure well Dressing: 2 x 2 sterile gauze and Band-Aid    Post-procedure details: Patient was observed during the procedure. Post-procedure instructions were  reviewed.  Patient left the clinic in stable condition.

## 2018-09-23 NOTE — Progress Notes (Signed)
Brittany Massey - 48 y.o. female MRN [id number]  Date of birth: 07-24-1970  Office Visit Note: Visit Date: 09/18/2018 PCP: Denzil Magnuson, NP Referred by: Denzil Magnuson, NP  Subjective: Chief Complaint  Patient presents with  . Neck - Pain  . Right Shoulder - Pain  . Left Shoulder - Pain  . Right Arm - Pain  . Left Arm - Pain   HPI: Brittany Massey is a 48 y.o. female who comes in today For planned C7-T1 interlaminar epidural steroid injection for chronic worsening neck pain bilateral shoulder and arm pain.  Patient is followed by Dr. Eduard Roux and an MRI of the cervical and thoracic spine was performed.  Cervical spine MRI was reviewed with her and reviewed below.  She is failed all conservative care otherwise.  At C5-6 she has some mild stenosis with canal measuring 7 mm as well as spondylosis and left more than right foraminal narrowing.  C6-7 shows left more than right foraminal narrowing as well.  No real focal nerve compression.  ROS Otherwise per HPI.  Assessment & Plan: Visit Diagnoses:  1. Cervical radiculopathy     Plan: No additional findings.   Meds & Orders:  Meds ordered this encounter  Medications  . methylPREDNISolone acetate (DEPO-MEDROL) injection 80 mg    Orders Placed This Encounter  Procedures  . XR C-ARM NO REPORT  . Epidural Steroid injection    Follow-up: No follow-ups on file.   Procedures: No procedures performed  Cervical Epidural Steroid Injection - Interlaminar Approach with Fluoroscopic Guidance  Patient: Brittany Massey      Date of Birth: January 21, 1971 MRN: [id number] PCP: Denzil Magnuson, NP      Visit Date: 09/18/2018   Universal Protocol:    Date/Time: 09/22/2008:44 AM  Consent Given By: the patient  Position: PRONE  Additional Comments: Vital signs were monitored before and after the procedure. Patient was prepped and draped in the usual sterile fashion. The correct patient, procedure, and site was verified.   Injection  Procedure Details:  Procedure Site One Meds Administered:  Meds ordered this encounter  Medications  . methylPREDNISolone acetate (DEPO-MEDROL) injection 80 mg     Laterality: Left  Location/Site: C7-T1  Needle size: 20 G  Needle type: Touhy  Needle Placement: Paramedian epidural space  Findings:  -Comments: Excellent flow of contrast along the nerve and into the epidural space.  Procedure Details: Using a paramedian approach from the side mentioned above, the region overlying the inferior lamina was localized under fluoroscopic visualization and the soft tissues overlying this structure were infiltrated with 4 ml. of 1% Lidocaine without Epinephrine. A # 20 gauge, Tuohy needle was inserted into the epidural space using a paramedian approach.  The epidural space was localized using loss of resistance along with lateral and contralateral oblique bi-planar fluoroscopic views.  After negative aspirate for air, blood, and CSF, a 2 ml. volume of Isovue-250 was injected into the epidural space and the flow of contrast was observed. Radiographs were obtained for documentation purposes.   The injectate was administered into the level noted above.  Additional Comments:  The patient tolerated the procedure well Dressing: 2 x 2 sterile gauze and Band-Aid    Post-procedure details: Patient was observed during the procedure. Post-procedure instructions were reviewed.  Patient left the clinic in stable condition.    Clinical History: MRI CERVICAL SPINE WITHOUT CONTRAST  TECHNIQUE: Multiplanar, multisequence MR imaging of the cervical spine was performed. No intravenous contrast  was administered.  COMPARISON:  Radiograph May 16, 2018  FINDINGS: Alignment: There is straightening of the normal cervical lordosis. A minimal retrolisthesis of C5 on C6 is seen.  Vertebrae: The vertebral body heights are well maintained. There is a T1/T2 bright osseous lesion seen within the  right C5 vertebral body, likely intraosseous hemangioma. No fracture, marrow edema,or pathologic marrow infiltration.  Cord: Normal signal and morphology.  Posterior Fossa, vertebral arteries, paraspinal tissues:  The visualized portion of the posterior fossa is unremarkable. Normal flow voids seen within the vertebral arteries. The paraspinal soft tissues are unremarkable.  Disc levels:  C1-C2: No significant canal or neural foraminal narrowing.  C2-C3: No significant spinal canal or neural foraminal narrowing  C3-C4: No significant spinal canal or neural foraminal narrowing  C4-C5: No significant spinal canal or neural foraminal narrowing  C5-C6: There is a disc osteophyte complex which is eccentric to the left. There is mild effacement anterior thecal sac measuring 7 mm in AP dimension. There is mild right neural foraminal narrowing.  C6-C7: there is a disc osteophyte complex which causes mild effacement of the anterior thecal sac. There is moderate left neural foraminal narrowing.  C7-T1: No significant spinal canal or neural foraminal narrowing  IMPRESSION: Cervical spine spondylosis most notable C5-C6 with mild central canal narrowing and at C6-7 moderate left neural foraminal narrowing.  Minimal retrolisthesis of C5 on C6.   Electronically Signed   By: Prudencio Pair M.D.   On: 08/21/2018 21:08   She reports that she quit smoking about 13 years ago. Her smoking use included cigarettes. She smoked 1.00 pack per day. She has never used smokeless tobacco. No results for input(s): HGBA1C, LABURIC in the last 8760 hours.  Objective:  VS:  HT:    WT:   BMI:     BP:(!) 147/100  HR:83bpm  TEMP: ( )  RESP:  Physical Exam  Ortho Exam Imaging: No results found.  Past Medical/Family/Surgical/Social History: Medications & Allergies reviewed per EMR, new medications updated. Patient Active Problem List   Diagnosis Date Noted  . Radiculopathy of  cervical spine 05/16/2018  . PVC (premature ventricular contraction) 11/28/2017  . Chest pain 11/04/2017  . Abnormal perimenopausal bleeding 12/19/2015   Past Medical History:  Diagnosis Date  . Chest pain 11/04/2017   Family History  Problem Relation Age of Onset  . Hyperlipidemia Mother   . Barrett's esophagus Mother   . Thyroid disease Mother   . Thyroid disease Father   . Bladder Cancer Father   . Thyroid disease Sister   . Thyroid disease Maternal Grandmother   . Heart disease Neg Hx   . Heart attack Neg Hx    Past Surgical History:  Procedure Laterality Date  . APPENDECTOMY    . CESAREAN SECTION    . OVARIAN CYST REMOVAL     Social History   Occupational History  . Not on file  Tobacco Use  . Smoking status: Former Smoker    Packs/day: 1.00    Types: Cigarettes    Quit date: 2007    Years since quitting: 13.6  . Smokeless tobacco: Never Used  Substance and Sexual Activity  . Alcohol use: Yes    Comment: occ wine  . Drug use: Never  . Sexual activity: Not on file

## 2018-09-26 ENCOUNTER — Encounter: Payer: Self-pay | Admitting: Orthopaedic Surgery

## 2018-09-29 ENCOUNTER — Encounter: Payer: Self-pay | Admitting: Physical Medicine and Rehabilitation

## 2018-10-10 ENCOUNTER — Telehealth: Payer: Self-pay | Admitting: Orthopaedic Surgery

## 2018-10-10 DIAGNOSIS — M546 Pain in thoracic spine: Secondary | ICD-10-CM | POA: Diagnosis not present

## 2018-10-10 DIAGNOSIS — R202 Paresthesia of skin: Secondary | ICD-10-CM | POA: Diagnosis not present

## 2018-10-10 DIAGNOSIS — M542 Cervicalgia: Secondary | ICD-10-CM | POA: Diagnosis not present

## 2018-10-10 DIAGNOSIS — R293 Abnormal posture: Secondary | ICD-10-CM | POA: Diagnosis not present

## 2018-10-10 NOTE — Telephone Encounter (Signed)
Stacy from [facility] called. She would like to know if patient could get dry needled. Her call back number is [phone]

## 2018-10-10 NOTE — Telephone Encounter (Signed)
Please advise 

## 2018-10-11 NOTE — Telephone Encounter (Signed)
yes

## 2018-10-13 NOTE — Telephone Encounter (Signed)
faxed

## 2018-10-13 NOTE — Telephone Encounter (Signed)
I called Brittany Massey and advised. She would like fax rx sent to [phone].

## 2018-10-14 ENCOUNTER — Telehealth: Payer: Self-pay | Admitting: Orthopaedic Surgery

## 2018-10-14 NOTE — Telephone Encounter (Signed)
Sure.  I'm not familiar with it either.  Ask the PT who we should refer her to for the syndrome.  Thanks.

## 2018-10-14 NOTE — Telephone Encounter (Signed)
Do you want to talk to her?  I am not familiar at all with this.  T-spine mri unremarkable

## 2018-10-14 NOTE — Telephone Encounter (Signed)
Stacy from [employer] called. She would like to talk to Dr. Erlinda Hong about patient's condition. 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 Her call back number is [phone]

## 2018-10-14 NOTE — Telephone Encounter (Signed)
pls advise

## 2018-10-15 DIAGNOSIS — R202 Paresthesia of skin: Secondary | ICD-10-CM | POA: Diagnosis not present

## 2018-10-15 DIAGNOSIS — M542 Cervicalgia: Secondary | ICD-10-CM | POA: Diagnosis not present

## 2018-10-15 DIAGNOSIS — M546 Pain in thoracic spine: Secondary | ICD-10-CM | POA: Diagnosis not present

## 2018-10-15 DIAGNOSIS — R293 Abnormal posture: Secondary | ICD-10-CM | POA: Diagnosis not present

## 2018-10-15 NOTE — Telephone Encounter (Signed)
I spoke to PT.  She is going to try and treat with PT

## 2018-10-22 DIAGNOSIS — M542 Cervicalgia: Secondary | ICD-10-CM | POA: Diagnosis not present

## 2018-10-22 DIAGNOSIS — M546 Pain in thoracic spine: Secondary | ICD-10-CM | POA: Diagnosis not present

## 2018-10-22 DIAGNOSIS — R293 Abnormal posture: Secondary | ICD-10-CM | POA: Diagnosis not present

## 2018-10-22 DIAGNOSIS — R202 Paresthesia of skin: Secondary | ICD-10-CM | POA: Diagnosis not present

## 2018-10-27 NOTE — Telephone Encounter (Signed)
Closing encounter

## 2018-10-29 DIAGNOSIS — R293 Abnormal posture: Secondary | ICD-10-CM | POA: Diagnosis not present

## 2018-10-29 DIAGNOSIS — M546 Pain in thoracic spine: Secondary | ICD-10-CM | POA: Diagnosis not present

## 2018-10-29 DIAGNOSIS — R202 Paresthesia of skin: Secondary | ICD-10-CM | POA: Diagnosis not present

## 2018-10-29 DIAGNOSIS — M542 Cervicalgia: Secondary | ICD-10-CM | POA: Diagnosis not present

## 2018-11-05 DIAGNOSIS — M546 Pain in thoracic spine: Secondary | ICD-10-CM | POA: Diagnosis not present

## 2018-11-05 DIAGNOSIS — R202 Paresthesia of skin: Secondary | ICD-10-CM | POA: Diagnosis not present

## 2018-11-05 DIAGNOSIS — M542 Cervicalgia: Secondary | ICD-10-CM | POA: Diagnosis not present

## 2018-11-05 DIAGNOSIS — R293 Abnormal posture: Secondary | ICD-10-CM | POA: Diagnosis not present

## 2018-12-09 DIAGNOSIS — Z20828 Contact with and (suspected) exposure to other viral communicable diseases: Secondary | ICD-10-CM | POA: Diagnosis not present

## 2018-12-31 DIAGNOSIS — Z131 Encounter for screening for diabetes mellitus: Secondary | ICD-10-CM | POA: Diagnosis not present

## 2018-12-31 DIAGNOSIS — Z0000 Encounter for general adult medical examination without abnormal findings: Secondary | ICD-10-CM | POA: Diagnosis not present

## 2018-12-31 DIAGNOSIS — Z13220 Encounter for screening for lipoid disorders: Secondary | ICD-10-CM | POA: Diagnosis not present

## 2019-02-26 DIAGNOSIS — Z20828 Contact with and (suspected) exposure to other viral communicable diseases: Secondary | ICD-10-CM | POA: Diagnosis not present

## 2019-02-26 DIAGNOSIS — Z03818 Encounter for observation for suspected exposure to other biological agents ruled out: Secondary | ICD-10-CM | POA: Diagnosis not present

## 2019-08-31 ENCOUNTER — Telehealth: Payer: Self-pay | Admitting: Orthopaedic Surgery

## 2019-08-31 DIAGNOSIS — M5412 Radiculopathy, cervical region: Secondary | ICD-10-CM

## 2019-08-31 DIAGNOSIS — M542 Cervicalgia: Secondary | ICD-10-CM

## 2019-08-31 NOTE — Telephone Encounter (Signed)
Patient called asked if she can be referred back to Dr Ernestina Patches for a back injection? The number to contact patient is [phone]

## 2019-08-31 NOTE — Telephone Encounter (Signed)
Pls advise. [facility] for this?

## 2019-08-31 NOTE — Telephone Encounter (Signed)
Tried calling patient. No answer.  I put in order for [facility]

## 2019-08-31 NOTE — Telephone Encounter (Signed)
sure

## 2019-09-14 ENCOUNTER — Other Ambulatory Visit: Payer: Self-pay | Admitting: Physical Medicine and Rehabilitation

## 2019-09-14 ENCOUNTER — Telehealth: Payer: Self-pay | Admitting: Physical Medicine and Rehabilitation

## 2019-09-14 DIAGNOSIS — F411 Generalized anxiety disorder: Secondary | ICD-10-CM

## 2019-09-14 MED ORDER — DIAZEPAM 5 MG PO TABS: ORAL_TABLET | ORAL | 0 refills | Status: DC

## 2019-09-14 MED FILL — diazePAM 5 MG TABS: 5 | 2 days supply | Qty: 2 | Fill #0

## 2019-09-14 NOTE — Telephone Encounter (Signed)
Pt called stating her pain escalating and she would like a CB; I did let her know her referral had been put in and when authorized they would reach out to her shortly and the pt stated it was urgent.   [phone]

## 2019-09-14 NOTE — Telephone Encounter (Signed)
Done, sent to Outpatient pharm

## 2019-09-14 NOTE — Progress Notes (Signed)
Pre-procedure diazepam ordered for pre-operative anxiety.  

## 2019-09-14 NOTE — Telephone Encounter (Signed)
Patient is scheduled for ESI on 8/25 and would like Valium prior. Pharmacy has been updated.

## 2019-09-16 ENCOUNTER — Ambulatory Visit: Payer: Self-pay

## 2019-09-16 ENCOUNTER — Ambulatory Visit (INDEPENDENT_AMBULATORY_CARE_PROVIDER_SITE_OTHER): Payer: BC Managed Care – PPO | Admitting: Physical Medicine and Rehabilitation

## 2019-09-16 ENCOUNTER — Other Ambulatory Visit: Payer: Self-pay | Admitting: Physical Medicine and Rehabilitation

## 2019-09-16 ENCOUNTER — Other Ambulatory Visit: Payer: Self-pay

## 2019-09-16 ENCOUNTER — Encounter: Payer: Self-pay | Admitting: Physical Medicine and Rehabilitation

## 2019-09-16 VITALS — BP 128/90 | HR 86

## 2019-09-16 DIAGNOSIS — M5412 Radiculopathy, cervical region: Secondary | ICD-10-CM | POA: Diagnosis not present

## 2019-09-16 MED ORDER — METHYLPREDNISOLONE ACETATE 80 MG/ML IJ SUSP
80.0000 mg | Freq: Once | INTRAMUSCULAR | Status: AC
  Administered 2019-09-16: 80 mg

## 2019-09-16 MED ORDER — DIAZEPAM 5 MG PO TABS: ORAL_TABLET | ORAL | 0 refills | Status: DC

## 2019-09-16 NOTE — Telephone Encounter (Signed)
Sent to [facility], there was also a [facility]

## 2019-09-16 NOTE — Telephone Encounter (Signed)
I though pharmacy update had saved. [facility]

## 2019-09-16 NOTE — Progress Notes (Signed)
Sent to [facility]

## 2019-09-16 NOTE — Progress Notes (Signed)
Pt state the pain starte in the middle of both shoulder that shoot through to the front of her chest.  Numeric Pain Rating Scale and Functional Assessment Average Pain 1   In the last MONTH (on 0-10 scale) has pain interfered with the following?  1. General activity like being  able to carry out your everyday physical activities such as walking, climbing stairs, carrying groceries, or moving a chair?  Rating(8)   +Driver, -BT, -Dye Allergies.

## 2019-10-04 NOTE — Procedures (Signed)
Cervical Epidural Steroid Injection - Interlaminar Approach with Fluoroscopic Guidance  Patient: Brittany Massey      Date of Birth: July 17, 1970 MRN: [id number] PCP: Denzil Magnuson, NP      Visit Date: 09/16/2019   Universal Protocol:    Date/Time: 09/12/215:16 PM  Consent Given By: the patient  Position: PRONE  Additional Comments: Vital signs were monitored before and after the procedure. Patient was prepped and draped in the usual sterile fashion. The correct patient, procedure, and site was verified.   Injection Procedure Details:  Procedure Site One Meds Administered:  Meds ordered this encounter  Medications  . methylPREDNISolone acetate (DEPO-MEDROL) injection 80 mg     Laterality: Left  Location/Site: C7-T1  Needle size: 20 G  Needle type: Touhy  Needle Placement: Paramedian epidural space  Findings:  -Comments: Excellent flow of contrast into the epidural space.  Procedure Details: Using a paramedian approach from the side mentioned above, the region overlying the inferior lamina was localized under fluoroscopic visualization and the soft tissues overlying this structure were infiltrated with 4 ml. of 1% Lidocaine without Epinephrine. A # 20 gauge, Tuohy needle was inserted into the epidural space using a paramedian approach.  The epidural space was localized using loss of resistance along with lateral and contralateral oblique bi-planar fluoroscopic views.  After negative aspirate for air, blood, and CSF, a 2 ml. volume of Isovue-250 was injected into the epidural space and the flow of contrast was observed. Radiographs were obtained for documentation purposes.   The injectate was administered into the level noted above.  Additional Comments:  The patient tolerated the procedure well Dressing: 2 x 2 sterile gauze and Band-Aid    Post-procedure details: Patient was observed during the procedure. Post-procedure instructions were reviewed.  Patient left the  clinic in stable condition.

## 2019-10-04 NOTE — Progress Notes (Signed)
Brittany Massey - 49 y.o. female MRN [id number]  Date of birth: Jun 19, 1970  Office Visit Note: Visit Date: 09/16/2019 PCP: Brittany Magnuson, NP Referred by: Brittany Magnuson, NP  Subjective: Chief Complaint  Patient presents with  . Neck - Pain  . Right Shoulder - Pain  . Left Shoulder - Pain  . Chest - Pain   HPI:  Brittany Massey is a 49 y.o. female who comes in today for planned repeat Left C7-T1 Cervical epidural steroid injection with fluoroscopic guidance.  The patient has failed conservative care including home exercise, medications, time and activity modification.  This injection will be diagnostic and hopefully therapeutic.  Please see requesting physician notes for further details and justification. Patient received more than 50% pain relief from prior injection.   Referring: Dr. Eduard Massey  Patient feels like the symptoms are very similar to last year when she had the epidural injection that was quite beneficial.  MRI reviewed again.  Average pain only a 1 out of 10 but pain can be exacerbated at times to a 10 out of 10.   ROS Otherwise per HPI.  Assessment & Plan: Visit Diagnoses:  1. Cervical radiculopathy     Plan: No additional findings.   Meds & Orders:  Meds ordered this encounter  Medications  . methylPREDNISolone acetate (DEPO-MEDROL) injection 80 mg    Orders Placed This Encounter  Procedures  . XR C-ARM NO REPORT  . Epidural Steroid injection    Follow-up: Return if symptoms worsen or fail to improve.   Procedures: No procedures performed  Cervical Epidural Steroid Injection - Interlaminar Approach with Fluoroscopic Guidance  Patient: Brittany Massey      Date of Birth: 1970-07-12 MRN: [id number] PCP: Brittany Magnuson, NP      Visit Date: 09/16/2019   Universal Protocol:    Date/Time: 09/12/215:16 PM  Consent Given By: the patient  Position: PRONE  Additional Comments: Vital signs were monitored before and after the procedure. Patient was prepped  and draped in the usual sterile fashion. The correct patient, procedure, and site was verified.   Injection Procedure Details:  Procedure Site One Meds Administered:  Meds ordered this encounter  Medications  . methylPREDNISolone acetate (DEPO-MEDROL) injection 80 mg     Laterality: Left  Location/Site: C7-T1  Needle size: 20 G  Needle type: Touhy  Needle Placement: Paramedian epidural space  Findings:  -Comments: Excellent flow of contrast into the epidural space.  Procedure Details: Using a paramedian approach from the side mentioned above, the region overlying the inferior lamina was localized under fluoroscopic visualization and the soft tissues overlying this structure were infiltrated with 4 ml. of 1% Lidocaine without Epinephrine. A # 20 gauge, Tuohy needle was inserted into the epidural space using a paramedian approach.  The epidural space was localized using loss of resistance along with lateral and contralateral oblique bi-planar fluoroscopic views.  After negative aspirate for air, blood, and CSF, a 2 ml. volume of Isovue-250 was injected into the epidural space and the flow of contrast was observed. Radiographs were obtained for documentation purposes.   The injectate was administered into the level noted above.  Additional Comments:  The patient tolerated the procedure well Dressing: 2 x 2 sterile gauze and Band-Aid    Post-procedure details: Patient was observed during the procedure. Post-procedure instructions were reviewed.  Patient left the clinic in stable condition.     Clinical History: MRI CERVICAL SPINE WITHOUT CONTRAST  TECHNIQUE: Multiplanar, multisequence  MR imaging of the cervical spine was performed. No intravenous contrast was administered.  COMPARISON:  Radiograph May 16, 2018  FINDINGS: Alignment: There is straightening of the normal cervical lordosis. A minimal retrolisthesis of C5 on C6 is seen.  Vertebrae: The vertebral  body heights are well maintained. There is a T1/T2 bright osseous lesion seen within the right C5 vertebral body, likely intraosseous hemangioma. No fracture, marrow edema,or pathologic marrow infiltration.  Cord: Normal signal and morphology.  Posterior Fossa, vertebral arteries, paraspinal tissues:  The visualized portion of the posterior fossa is unremarkable. Normal flow voids seen within the vertebral arteries. The paraspinal soft tissues are unremarkable.  Disc levels:  C1-C2: No significant canal or neural foraminal narrowing.  C2-C3: No significant spinal canal or neural foraminal narrowing  C3-C4: No significant spinal canal or neural foraminal narrowing  C4-C5: No significant spinal canal or neural foraminal narrowing  C5-C6: There is a disc osteophyte complex which is eccentric to the left. There is mild effacement anterior thecal sac measuring 7 mm in AP dimension. There is mild right neural foraminal narrowing.  C6-C7: there is a disc osteophyte complex which causes mild effacement of the anterior thecal sac. There is moderate left neural foraminal narrowing.  C7-T1: No significant spinal canal or neural foraminal narrowing  IMPRESSION: Cervical spine spondylosis most notable C5-C6 with mild central canal narrowing and at C6-7 moderate left neural foraminal narrowing.  Minimal retrolisthesis of C5 on C6.   Electronically Signed   By: Brittany Massey M.D.   On: 08/21/2018 21:08     Objective:  VS:  HT:    WT:   BMI:     BP:128/90  HR:86bpm  TEMP: ( )  RESP:  Physical Exam Vitals and nursing note reviewed.  Constitutional:      General: She is not in acute distress.    Appearance: Normal appearance. She is not ill-appearing.  HENT:     Head: Normocephalic and atraumatic.     Right Ear: External ear normal.     Left Ear: External ear normal.  Eyes:     Extraocular Movements: Extraocular movements intact.  Cardiovascular:     Rate  and Rhythm: Normal rate.     Pulses: Normal pulses.  Musculoskeletal:     Cervical back: Tenderness present. No rigidity.     Right lower leg: No edema.     Left lower leg: No edema.     Comments: Patient has good strength in the upper extremities including 5 out of 5 strength in wrist extension long finger flexion and APB.  There is no atrophy of the hands intrinsically.  There is a negative Hoffmann's test.   Lymphadenopathy:     Cervical: No cervical adenopathy.  Skin:    Findings: No erythema, lesion or rash.  Neurological:     General: No focal deficit present.     Mental Status: She is alert and oriented to person, place, and time.     Sensory: No sensory deficit.     Motor: No weakness or abnormal muscle tone.     Coordination: Coordination normal.  Psychiatric:        Mood and Affect: Mood normal.        Behavior: Behavior normal.      Imaging: No results found.

## 2020-03-30 DIAGNOSIS — Z1231 Encounter for screening mammogram for malignant neoplasm of breast: Secondary | ICD-10-CM | POA: Diagnosis not present

## 2020-04-26 DIAGNOSIS — Z01419 Encounter for gynecological examination (general) (routine) without abnormal findings: Secondary | ICD-10-CM | POA: Diagnosis not present

## 2020-04-26 DIAGNOSIS — Z1151 Encounter for screening for human papillomavirus (HPV): Secondary | ICD-10-CM | POA: Diagnosis not present

## 2020-04-26 DIAGNOSIS — M5380 Other specified dorsopathies, site unspecified: Secondary | ICD-10-CM | POA: Diagnosis not present

## 2020-04-26 DIAGNOSIS — Z1329 Encounter for screening for other suspected endocrine disorder: Secondary | ICD-10-CM | POA: Diagnosis not present

## 2020-04-26 DIAGNOSIS — Z13220 Encounter for screening for lipoid disorders: Secondary | ICD-10-CM | POA: Diagnosis not present

## 2020-04-26 DIAGNOSIS — Z1321 Encounter for screening for nutritional disorder: Secondary | ICD-10-CM | POA: Diagnosis not present

## 2020-04-26 DIAGNOSIS — Z01411 Encounter for gynecological examination (general) (routine) with abnormal findings: Secondary | ICD-10-CM | POA: Diagnosis not present

## 2020-04-26 DIAGNOSIS — Z780 Asymptomatic menopausal state: Secondary | ICD-10-CM | POA: Diagnosis not present

## 2020-05-23 IMAGING — MR MRI CERVICAL SPINE WITHOUT CONTRAST
5 series · 39 of 48 positions shown · non-contrast
Comparison: Radiograph May 16, 2018

CLINICAL DATA: Neck pain pain between the shoulder blades since
09/25/2017

EXAM:
MRI CERVICAL SPINE WITHOUT CONTRAST
TECHNIQUE: Multiplanar, multisequence MR imaging of the cervical spine was
performed. No intravenous contrast was administered.

[Series 2: T2 · sagittal · 3.0mm · 0.41mm/px · 8 of 17 slices shown (1 of 2)]
[im 1/17]
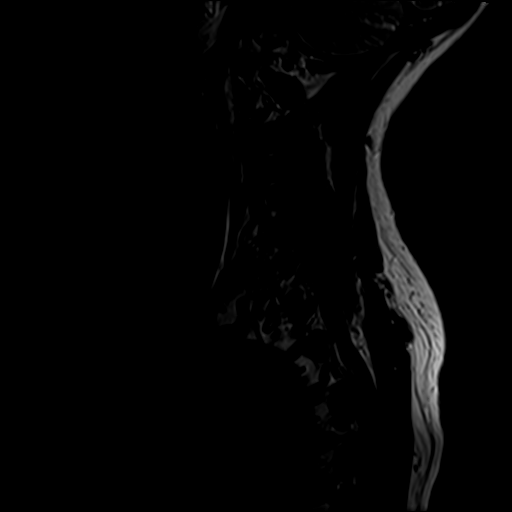
[im 3/17]
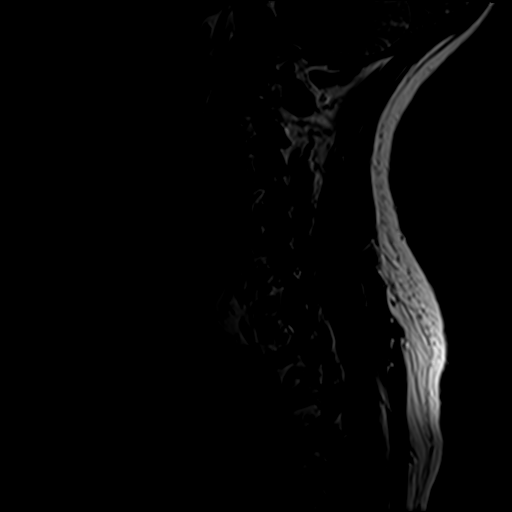
[im 5/17]
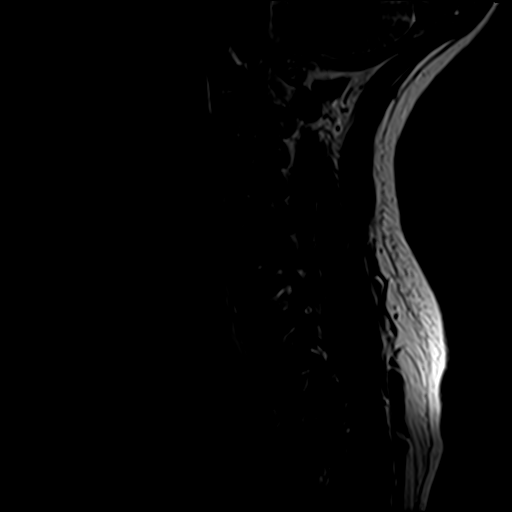
[im 7/17]
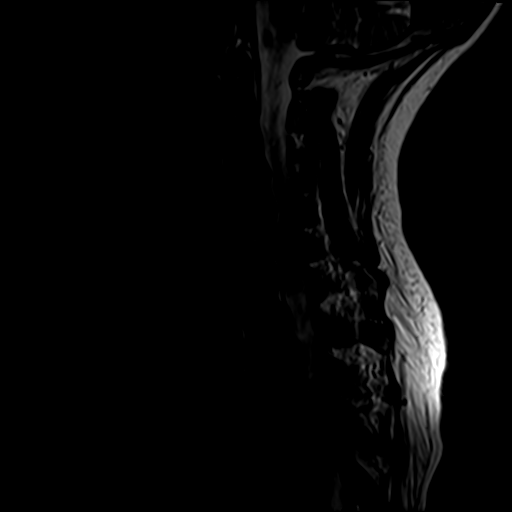
[im 10/17]
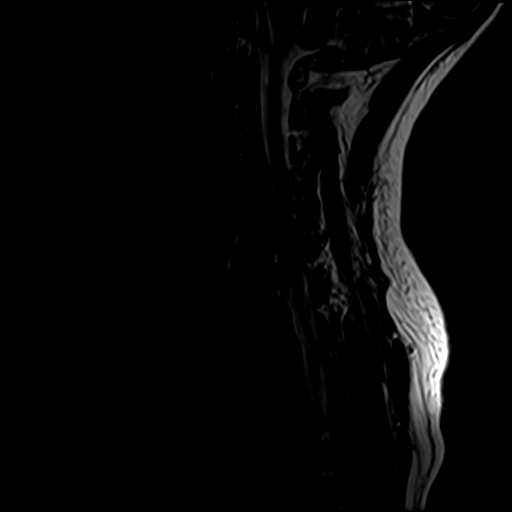
[im 12/17]
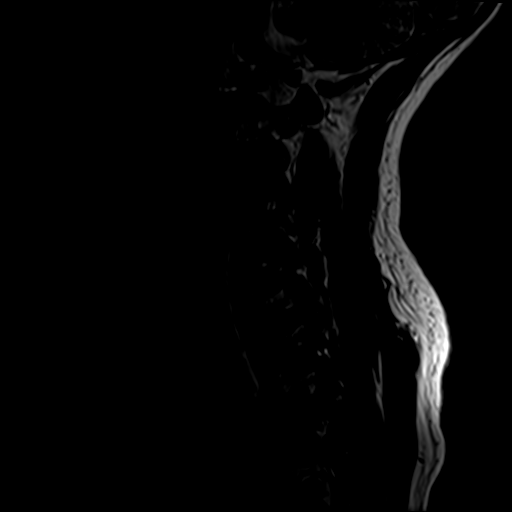
[im 14/17]
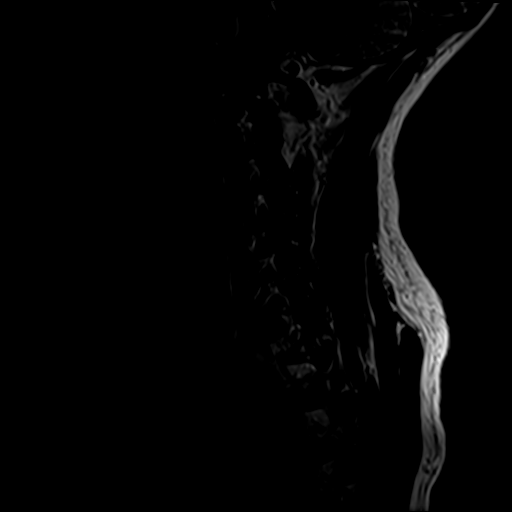
[im 17/17]
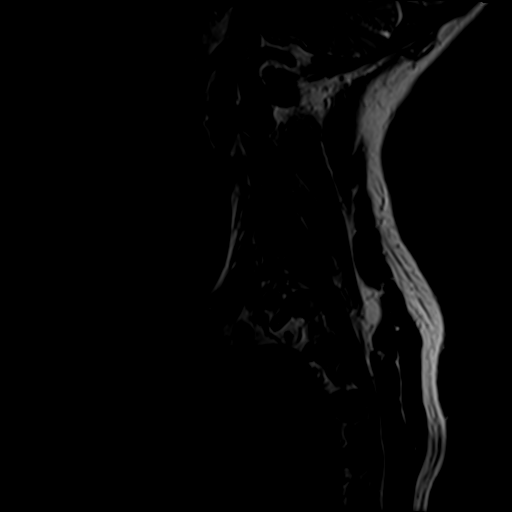

[Series 3: STIR · sagittal · 3.0mm · 0.82mm/px · 9 of 17 slices shown]
[im 1/17]
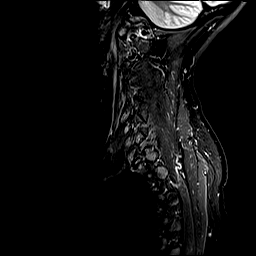
[im 3/17]
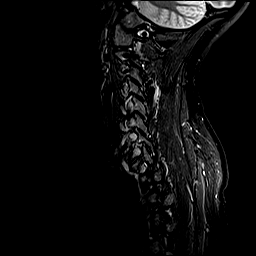
[im 5/17]
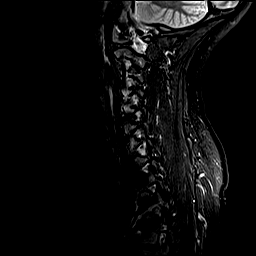
[im 7/17]
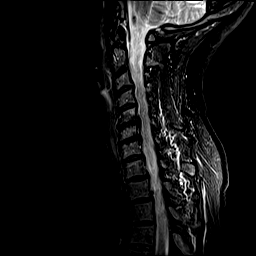
[im 9/17]
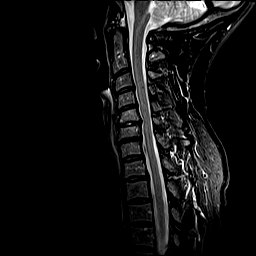
[im 11/17]
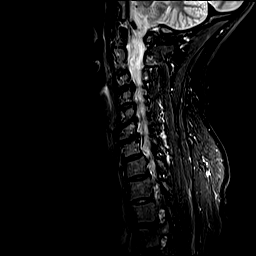
[im 13/17]
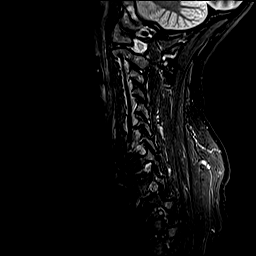
[im 15/17]
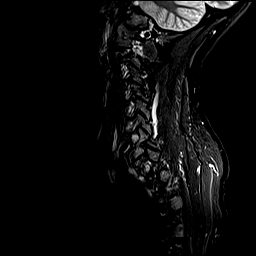
[im 17/17]
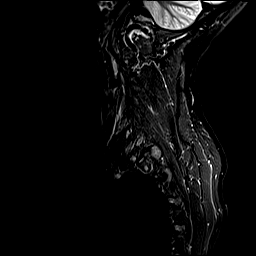

[Series 4: T1 · sagittal · 3.0mm · 0.82mm/px · 9 of 17 slices shown]
[im 1/17]
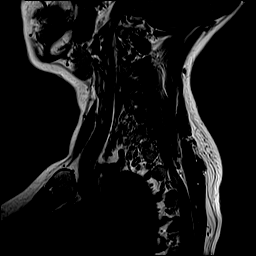
[im 3/17]
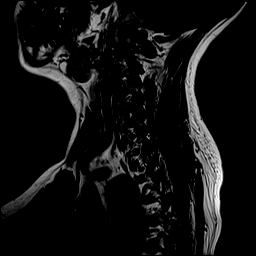
[im 5/17]
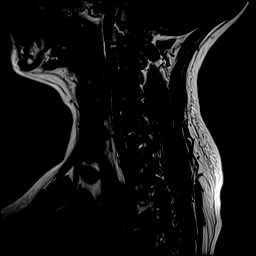
[im 7/17]
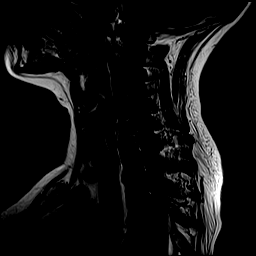
[im 9/17]
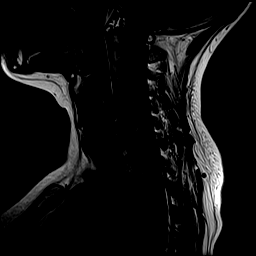
[im 11/17]
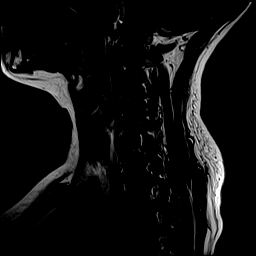
[im 13/17]
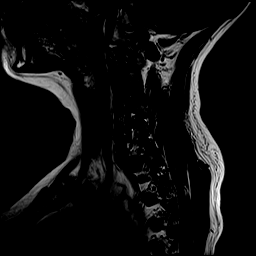
[im 15/17]
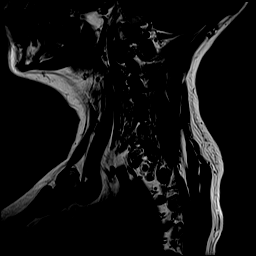
[im 17/17]
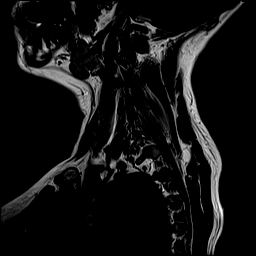

[Series 5: T2 · axial · 3.0mm · 0.70mm/px · z∈[-68,+4]mm · 11 of 22 slices shown (2 of 2)]
[im 1/22]
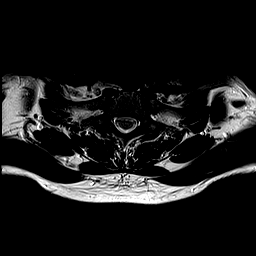
[im 3/22]
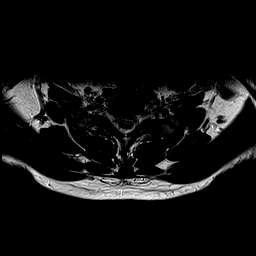
[im 5/22]
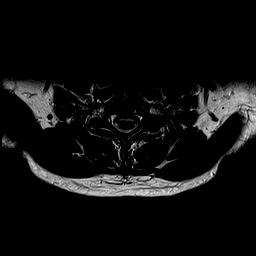
[im 7/22]
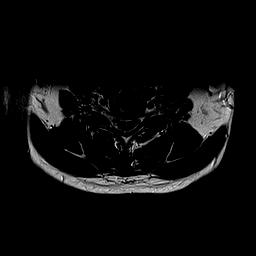
[im 9/22]
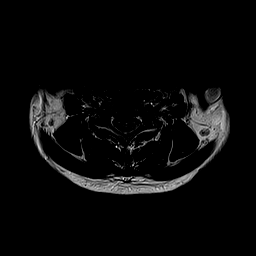
[im 11/22]
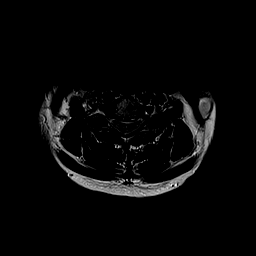
[im 13/22]
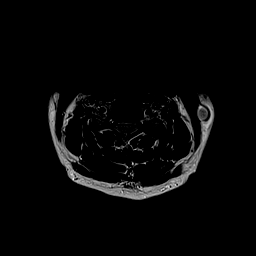
[im 15/22]
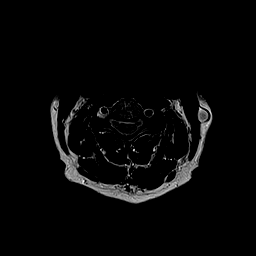
[im 17/22]
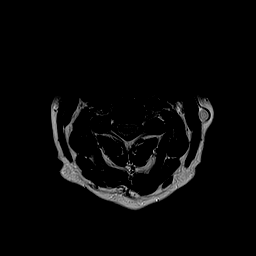
[im 19/22]
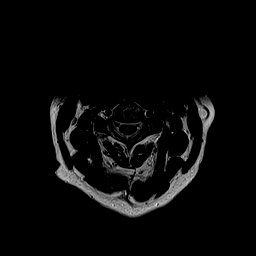
[im 22/22]
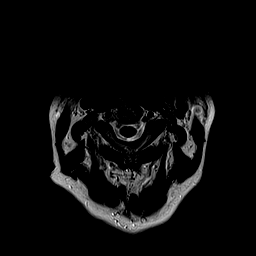

[Series 6: GRE · axial · 3.0mm · 0.35mm/px · z∈[-68,-54]mm · 2 of 22 slices shown]
[im 1/22]
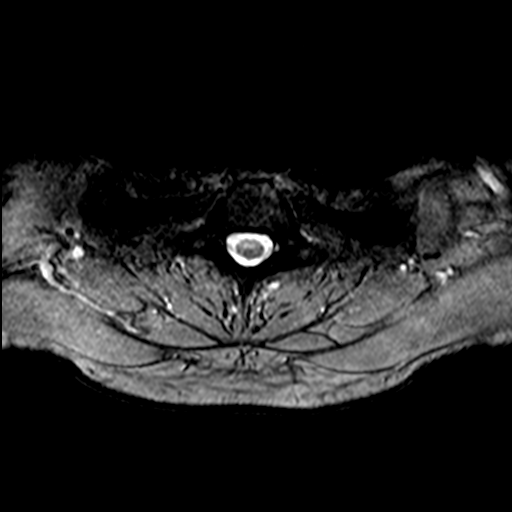
[im 5/22]
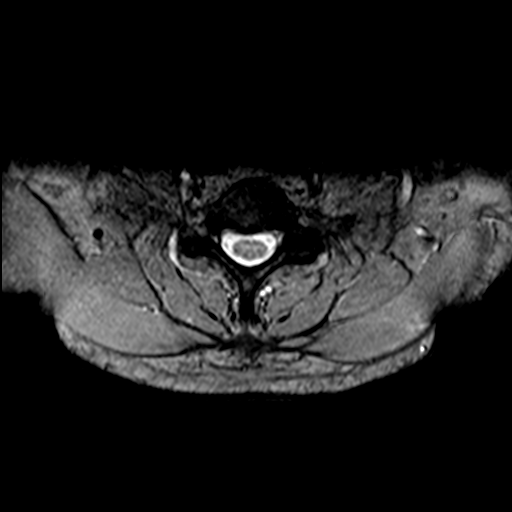

[39 of 48 positions shown; findings below may reference images not displayed]

FINDINGS: Alignment: There is straightening of the normal cervical lordosis. A
minimal retrolisthesis of C5 on C6 is seen.

Vertebrae: The vertebral body heights are well maintained. There is
a T1/T2 bright osseous lesion seen within the right C5 vertebral
body, likely intraosseous hemangioma. No fracture, marrow edema,or
pathologic marrow infiltration.

Cord: Normal signal and morphology.

Posterior Fossa, vertebral arteries, paraspinal tissues:

The visualized portion of the posterior fossa is unremarkable.
Normal flow voids seen within the vertebral arteries. The paraspinal
soft tissues are unremarkable.

Disc levels:

C1-C2: No significant canal or neural foraminal narrowing.

C2-C3: No significant spinal canal or neural foraminal narrowing

C3-C4: No significant spinal canal or neural foraminal narrowing

C4-C5: No significant spinal canal or neural foraminal narrowing

C5-C6: There is a disc osteophyte complex which is eccentric to the
left. There is mild effacement anterior thecal sac measuring 7 mm in
AP dimension. There is mild right neural foraminal narrowing.

C6-C7: there is a disc osteophyte complex which causes mild
effacement of the anterior thecal sac. There is moderate left neural
foraminal narrowing.

C7-T1: No significant spinal canal or neural foraminal narrowing
IMPRESSION: Cervical spine spondylosis most notable C5-C6 with mild central
canal narrowing and at C6-7 moderate left neural foraminal
narrowing.

Minimal retrolisthesis of C5 on C6.

## 2020-05-23 IMAGING — MR MRI THORACIC SPINE WITHOUT CONTRAST
4 of 6 series · 18 of 48 positions shown · non-contrast
Comparison: None.

CLINICAL DATA: Pain between the shoulder base and sternum since
09/25/2017

EXAM:
MRI THORACIC SPINE WITHOUT CONTRAST
TECHNIQUE: Multiplanar, multisequence MR imaging of the thoracic spine was
performed. No intravenous contrast was administered.

[Series 3: T2 · sagittal · 4.0mm · 0.50mm/px · 6 of 16 slices shown (1 of 3)]
[im 1/16]
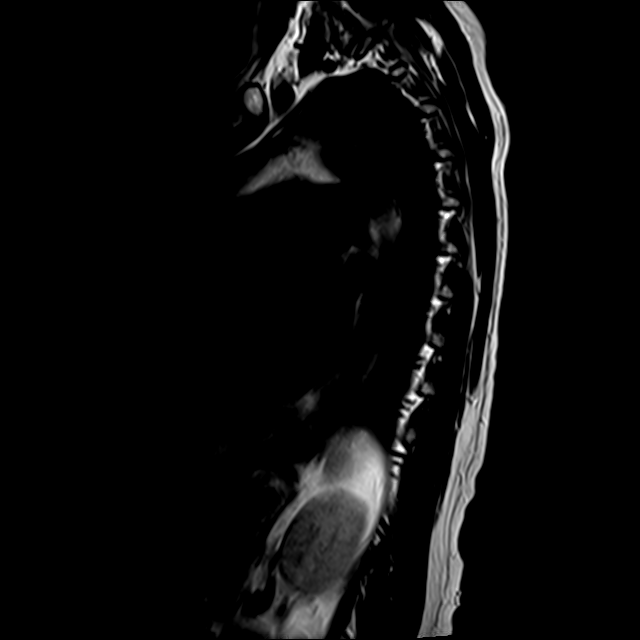
[im 4/16]
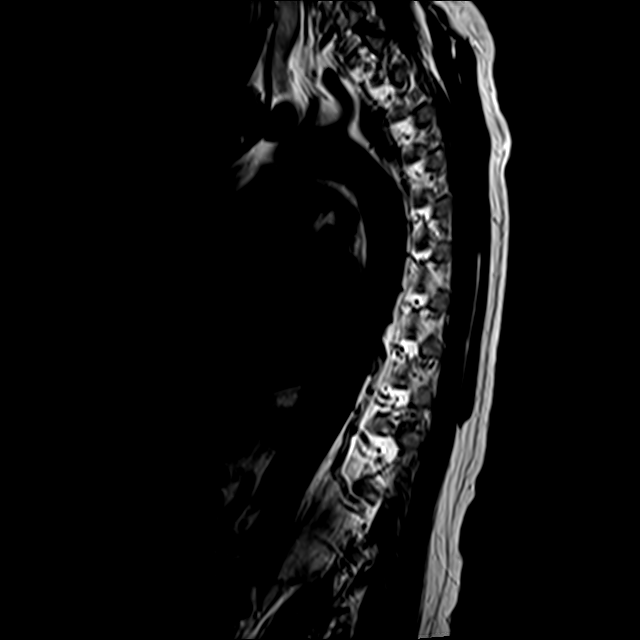
[im 7/16]
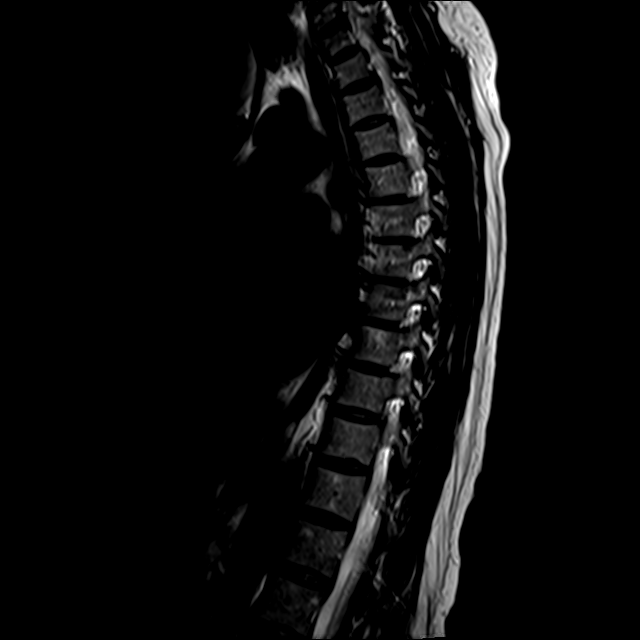
[im 10/16]
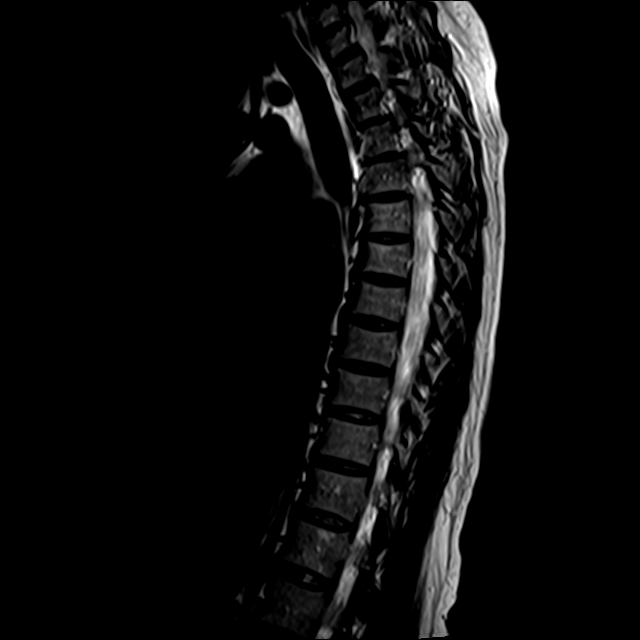
[im 13/16]
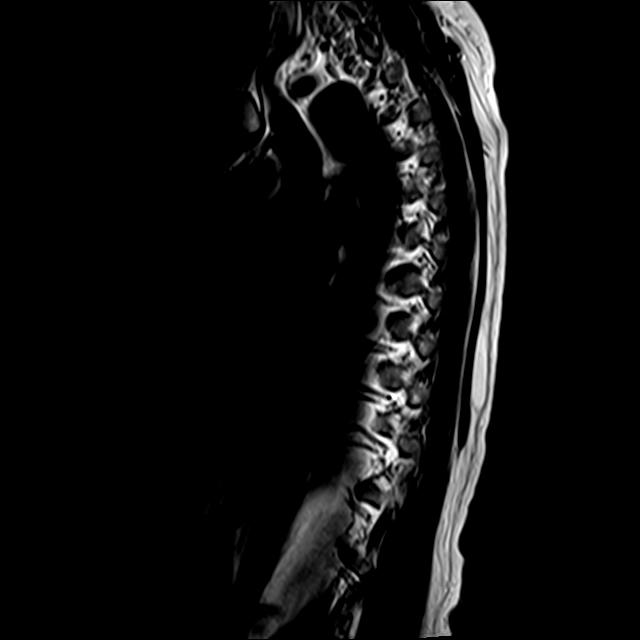
[im 16/16]
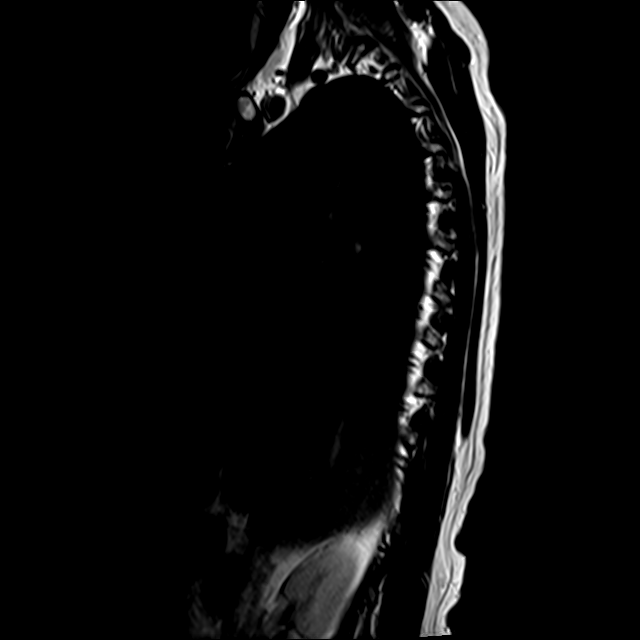

[Series 5: T1 · sagittal · 4.0mm · 1.00mm/px · 3 of 16 slices shown]
[im 4/16]
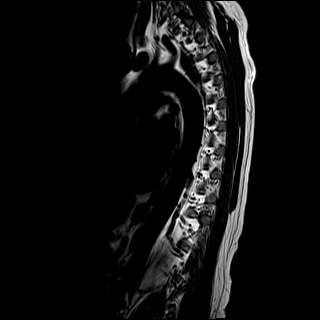
[im 10/16]
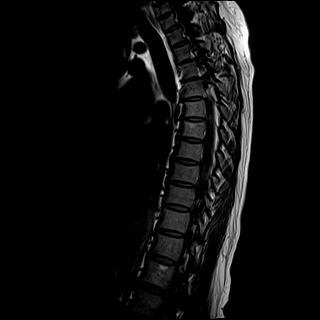
[im 16/16]
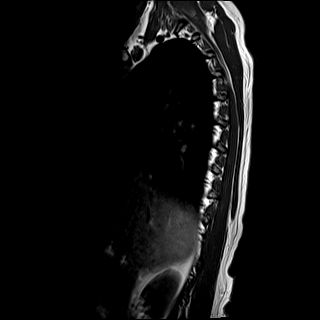

[Series 6: T2 · axial · 4.0mm · 0.39mm/px · z∈[-272,-100]mm · 6 of 36 slices shown (2 of 3)]
[im 1/36]
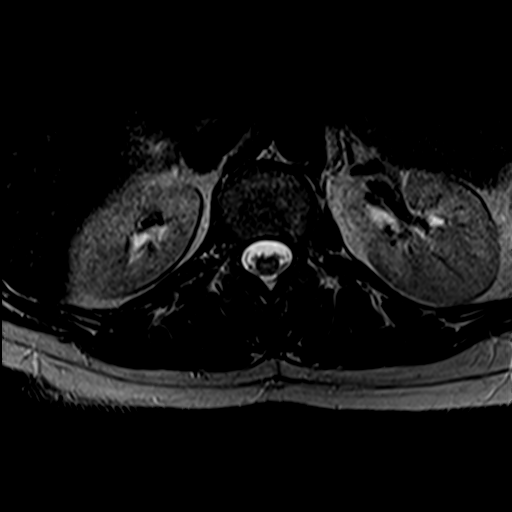
[im 6/36]
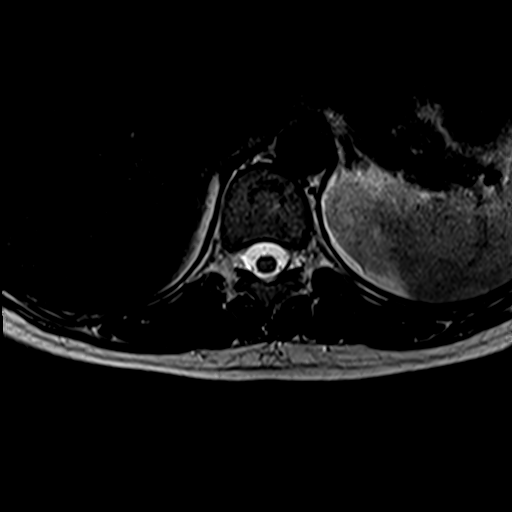
[im 12/36]
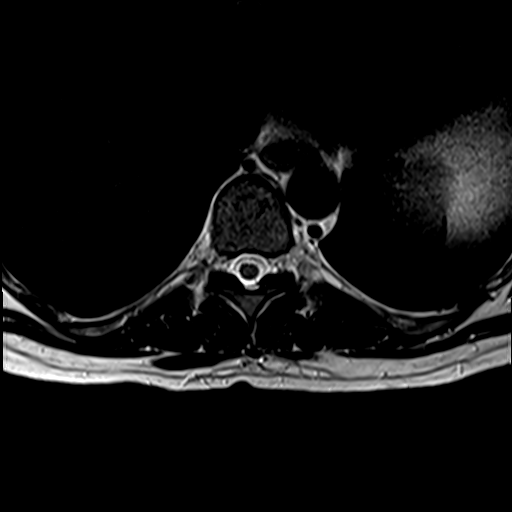
[im 15/36]
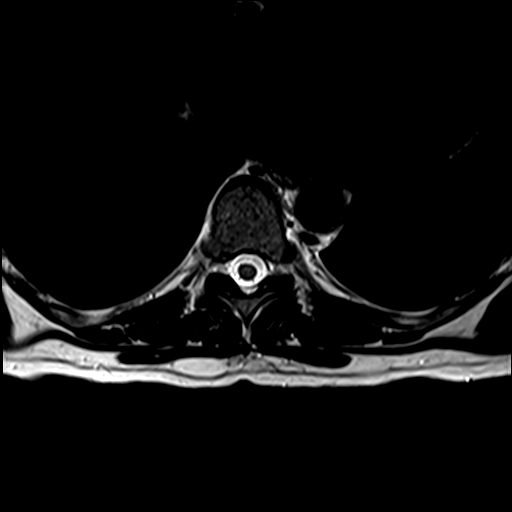
[im 18/36]
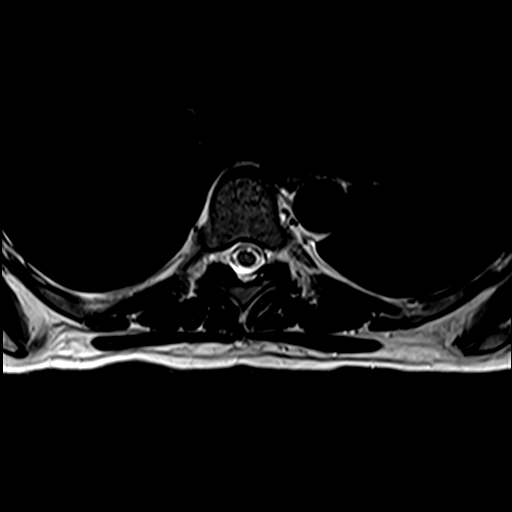
[im 30/36]
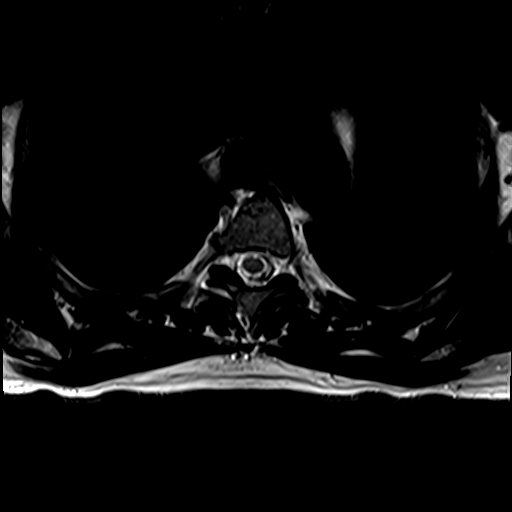

[Series 7: T2 · axial · 4.0mm · 0.39mm/px · z∈[-236,-100]mm · 3 of 36 slices shown (3 of 3)]
[im 6/36]
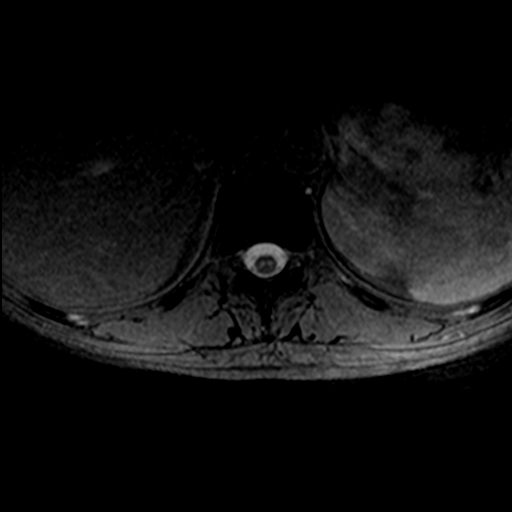
[im 18/36]
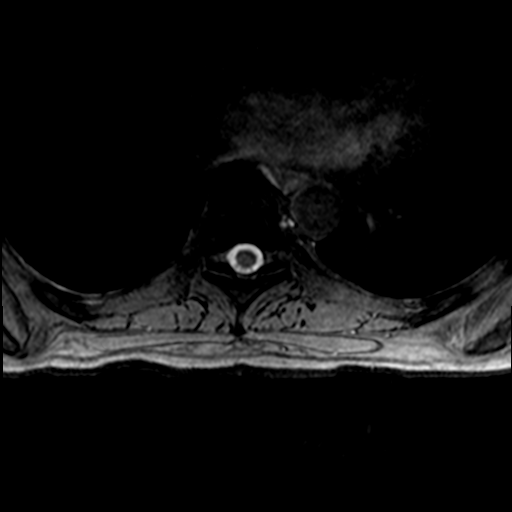
[im 30/36]
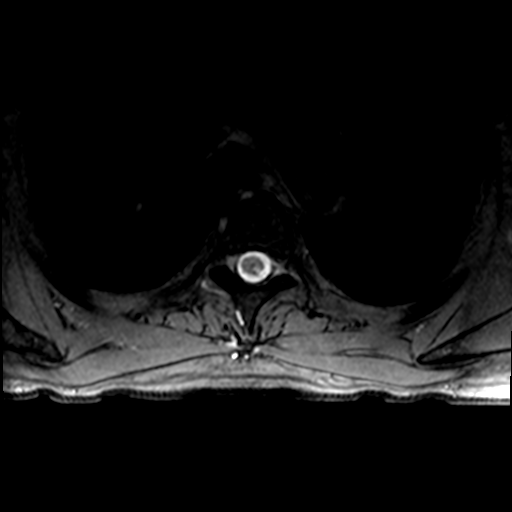

[18 of 48 positions shown; findings below may reference images not displayed]

FINDINGS: Alignment:  Physiologic

Vertebrae: There is a T2 bright rounded osseous lesion seen in the
posterior left T2 vertebral body which incompletely saturates on
stir images. No areas of cortical destruction are seen. No other
osseous lesion. The vertebral body heights are well maintained.

Cord:  Normal in signal and morphology.

Paraspinal and other soft tissues: The paraspinal soft tissues and
visualized retroperitoneal structures are unremarkable. The
sacroiliac joints are intact.

Disc levels:

T1-T2: No significant canal or neural foraminal narrowing

T2-T3: No significant canal or neural foraminal narrowing

T3-T4: No significant canal or neural foraminal narrowing

T4-T5: No significant canal or neural foraminal narrowing

T5-T6: No significant canal or neural foraminal narrowing

T6-T7: No significant canal or neural foraminal narrowing

T7-T8: No significant canal or neural foraminal narrowing

T8-T9: No significant canal or neural foraminal narrowing

T9-T10: No significant canal or neural foraminal narrowing

T10-T11: No significant canal or neural foraminal narrowing

T11-T12: No significant canal or neural foraminal narrowing
IMPRESSION: 1. No acute fracture.
2. No significant thoracic spine spondylosis
3. Subcentimeter osseous lesion in the T2 vertebral body which could
represent atypical hemangioma. No aggressive features are seen. If
further evaluation is required, would recommend CT thoracic spine.

## 2021-03-01 DIAGNOSIS — D485 Neoplasm of uncertain behavior of skin: Secondary | ICD-10-CM | POA: Diagnosis not present

## 2021-03-01 DIAGNOSIS — D0371 Melanoma in situ of right lower limb, including hip: Secondary | ICD-10-CM | POA: Diagnosis not present

## 2021-03-01 DIAGNOSIS — D225 Melanocytic nevi of trunk: Secondary | ICD-10-CM | POA: Diagnosis not present

## 2021-03-01 DIAGNOSIS — Z1283 Encounter for screening for malignant neoplasm of skin: Secondary | ICD-10-CM | POA: Diagnosis not present

## 2021-03-01 DIAGNOSIS — D2271 Melanocytic nevi of right lower limb, including hip: Secondary | ICD-10-CM | POA: Diagnosis not present

## 2021-03-22 DIAGNOSIS — D0371 Melanoma in situ of right lower limb, including hip: Secondary | ICD-10-CM | POA: Diagnosis not present

## 2021-03-22 DIAGNOSIS — X32XXXD Exposure to sunlight, subsequent encounter: Secondary | ICD-10-CM | POA: Diagnosis not present

## 2021-03-22 DIAGNOSIS — L570 Actinic keratosis: Secondary | ICD-10-CM | POA: Diagnosis not present

## 2021-03-22 DIAGNOSIS — L988 Other specified disorders of the skin and subcutaneous tissue: Secondary | ICD-10-CM | POA: Diagnosis not present

## 2021-03-22 DIAGNOSIS — D485 Neoplasm of uncertain behavior of skin: Secondary | ICD-10-CM | POA: Diagnosis not present

## 2021-03-22 DIAGNOSIS — L814 Other melanin hyperpigmentation: Secondary | ICD-10-CM | POA: Diagnosis not present

## 2021-07-26 ENCOUNTER — Ambulatory Visit: Payer: Self-pay | Admitting: Surgery

## 2021-07-26 DIAGNOSIS — R229 Localized swelling, mass and lump, unspecified: Secondary | ICD-10-CM | POA: Diagnosis not present

## 2021-07-26 NOTE — H&P (Signed)
   Frederick Peers [id number]   Referring Provider:  Self   Subjective   Chief Complaint: lipoma of back     History of Present Illness:    Very pleasant 51 year old woman who presents with an enlarging mass on her right upper back.  This has been present for about 4 years.  It is increased in size slowly over that time, and has become more painful especially when she has to lie on her back/do floor exercises.  She is interested in having it removed.  No previous procedures in the area.   Review of Systems: A complete review of systems was obtained from the patient.  I have reviewed this information and discussed as appropriate with the patient.  See HPI as well for other ROS.   Medical History: Past Medical History:  Diagnosis Date   Arthritis     There is no problem list on file for this patient.   Past Surgical History:  Procedure Laterality Date   APPENDECTOMY     CESAREAN SECTION     lasik eye       Allergies  Allergen Reactions   Bupropion Hives   Adhesive Rash   Adhesive Tape-Silicones Rash   Penicillins Itching and Rash   Sulfamethoxazole Rash    No current outpatient medications on file prior to visit.   No current facility-administered medications on file prior to visit.    Family History  Problem Relation Age of Onset   High blood pressure (Hypertension) Mother    Hyperlipidemia (Elevated cholesterol) Mother    Skin cancer Father      Social History   Tobacco Use  Smoking Status Former   Types: Cigarettes  Smokeless Tobacco Never     Social History   Socioeconomic History   Marital status: Divorced  Tobacco Use   Smoking status: Former    Types: Cigarettes   Smokeless tobacco: Never  Vaping Use   Vaping Use: Never used  Substance and Sexual Activity   Alcohol use: Yes   Drug use: Never    Objective:    Vitals:   07/26/21 1056  BP: 118/70  Pulse: 100  Temp: 36.8 C (98.2 F)  SpO2: 96%  Weight: 75 kg (165 lb 6.4 oz)   Height: 162.6 cm ('5\' 4"'$ )    Body mass index is 28.39 kg/m.  Alert and well-appearing Unlabored respirations To the right of midline on her upper/mid back is a smooth, mobile subcutaneous mass approximately 3 cm x 2 cm without overlying skin change or tenderness.   Assessment and Plan:  Diagnoses and all orders for this visit:  Subcutaneous mass    Discussed technique of excision and risks of bleeding, infection, pain, scarring, injury to underlying or adjacent structures, wound healing problems/seroma/hematoma, recurrence.  Questions welcomed and answered.  She wishes to proceed with scheduling.  Kate Sweetman Raquel James, MD

## 2021-07-26 NOTE — H&P (View-Only) (Signed)
   Brittany Massey [id number]   Referring Provider:  Self   Subjective   Chief Complaint: lipoma of back     History of Present Illness:    Very pleasant 51 year old woman who presents with an enlarging mass on her right upper back.  This has been present for about 4 years.  It is increased in size slowly over that time, and has become more painful especially when she has to lie on her back/do floor exercises.  She is interested in having it removed.  No previous procedures in the area.   Review of Systems: A complete review of systems was obtained from the patient.  I have reviewed this information and discussed as appropriate with the patient.  See HPI as well for other ROS.   Medical History: Past Medical History:  Diagnosis Date   Arthritis     There is no problem list on file for this patient.   Past Surgical History:  Procedure Laterality Date   APPENDECTOMY     CESAREAN SECTION     lasik eye       Allergies  Allergen Reactions   Bupropion Hives   Adhesive Rash   Adhesive Tape-Silicones Rash   Penicillins Itching and Rash   Sulfamethoxazole Rash    No current outpatient medications on file prior to visit.   No current facility-administered medications on file prior to visit.    Family History  Problem Relation Age of Onset   High blood pressure (Hypertension) Mother    Hyperlipidemia (Elevated cholesterol) Mother    Skin cancer Father      Social History   Tobacco Use  Smoking Status Former   Types: Cigarettes  Smokeless Tobacco Never     Social History   Socioeconomic History   Marital status: Divorced  Tobacco Use   Smoking status: Former    Types: Cigarettes   Smokeless tobacco: Never  Vaping Use   Vaping Use: Never used  Substance and Sexual Activity   Alcohol use: Yes   Drug use: Never    Objective:    Vitals:   07/26/21 1056  BP: 118/70  Pulse: 100  Temp: 36.8 C (98.2 F)  SpO2: 96%  Weight: 75 kg (165 lb 6.4 oz)   Height: 162.6 cm ('5\' 4"'$ )    Body mass index is 28.39 kg/m.  Alert and well-appearing Unlabored respirations To the right of midline on her upper/mid back is a smooth, mobile subcutaneous mass approximately 3 cm x 2 cm without overlying skin change or tenderness.   Assessment and Plan:  Diagnoses and all orders for this visit:  Subcutaneous mass    Discussed technique of excision and risks of bleeding, infection, pain, scarring, injury to underlying or adjacent structures, wound healing problems/seroma/hematoma, recurrence.  Questions welcomed and answered.  She wishes to proceed with scheduling.  Raykwon Hobbs Raquel James, MD

## 2021-08-02 ENCOUNTER — Encounter (HOSPITAL_COMMUNITY): Payer: Self-pay

## 2021-08-02 ENCOUNTER — Other Ambulatory Visit: Payer: Self-pay

## 2021-08-02 NOTE — Progress Notes (Signed)
For Short Stay: [facility] appointment date: N/A Date of COVID positive in last 68 days:N/A  Bowel Prep reminder: N/A   For Anesthesia: PCP - [facility] - Munley, Hilton Cork, MD last seen in 2019  Chest x-ray - 10/15/17 in epic EKG - 11/05/17 in epic Stress Test - 2019 ECHO - 11/12/17 in epic Cardiac Cath - N/A Pacemaker/ICD device last checked: N/A Pacemaker orders received: N/A Device Rep notified: N/A  Spinal Cord Stimulator: N/A  Sleep Study - N/A CPAP - N/A  Fasting Blood Sugar - N/A Checks Blood Sugar ___N/A__ times a day Date and result of last Hgb A1c-N/A  Blood Thinner Instructions: N/A Aspirin Instructions: N/A Last Dose: N/A  Activity level:  Able to exercise without chest pain and/or shortness of breath      Anesthesia review: N/A  Patient denies shortness of breath, fever, cough and chest pain at PAT appointment   Patient verbalized understanding of instructions that were given to them at the PAT appointment. Patient was also instructed that they will need to review over the PAT instructions again at home before surgery.

## 2021-08-04 ENCOUNTER — Other Ambulatory Visit: Payer: Self-pay

## 2021-08-04 ENCOUNTER — Ambulatory Visit (HOSPITAL_COMMUNITY): Payer: BC Managed Care – PPO | Admitting: Anesthesiology

## 2021-08-04 ENCOUNTER — Encounter (HOSPITAL_COMMUNITY)
Admission: RE | Disposition: A | Discharge status: Home / Self Care | Payer: Self-pay | Source: Home / Self Care | Attending: Surgery

## 2021-08-04 ENCOUNTER — Ambulatory Visit (HOSPITAL_COMMUNITY)
Admission: RE | Admit: 2021-08-04 | Discharge: 2021-08-04 | Disposition: A | Discharge status: Home / Self Care | Payer: BC Managed Care – PPO | Attending: Surgery | Admitting: Surgery

## 2021-08-04 ENCOUNTER — Encounter (HOSPITAL_COMMUNITY): Payer: Self-pay | Admitting: Surgery

## 2021-08-04 DIAGNOSIS — D171 Benign lipomatous neoplasm of skin and subcutaneous tissue of trunk: Secondary | ICD-10-CM | POA: Insufficient documentation

## 2021-08-04 DIAGNOSIS — M1990 Unspecified osteoarthritis, unspecified site: Secondary | ICD-10-CM | POA: Diagnosis not present

## 2021-08-04 DIAGNOSIS — R222 Localized swelling, mass and lump, trunk: Secondary | ICD-10-CM | POA: Diagnosis not present

## 2021-08-04 DIAGNOSIS — Z87891 Personal history of nicotine dependence: Secondary | ICD-10-CM | POA: Insufficient documentation

## 2021-08-04 HISTORY — DX: Carpal tunnel syndrome, bilateral upper limbs: G56.03

## 2021-08-04 HISTORY — DX: Dorsalgia, unspecified: M54.9

## 2021-08-04 HISTORY — PX: MASS EXCISION: SHX2000

## 2021-08-04 HISTORY — DX: Unspecified osteoarthritis, unspecified site: M19.90

## 2021-08-04 LAB — CBC WITH DIFFERENTIAL/PLATELET
Abs Immature Granulocytes: 0.01 10*3/uL (ref 0.00–0.07)
Basophils Absolute: 0.1 10*3/uL (ref 0.0–0.1)
Basophils Relative: 1 %
Eosinophils Absolute: 0.1 10*3/uL (ref 0.0–0.5)
Eosinophils Relative: 2 %
HCT: 43.1 % (ref 36.0–46.0)
Hemoglobin: 14.7 g/dL (ref 12.0–15.0)
Immature Granulocytes: 0 %
Lymphocytes Relative: 47 %
Lymphs Abs: 2.9 10*3/uL (ref 0.7–4.0)
MCH: 33.3 pg (ref 26.0–34.0)
MCHC: 34.1 g/dL (ref 30.0–36.0)
MCV: 97.7 fL (ref 80.0–100.0)
Monocytes Absolute: 0.4 10*3/uL (ref 0.1–1.0)
Monocytes Relative: 6 %
Neutro Abs: 2.7 10*3/uL (ref 1.7–7.7)
Neutrophils Relative %: 44 %
Platelets: 246 10*3/uL (ref 150–400)
RBC: 4.41 MIL/uL (ref 3.87–5.11)
RDW: 12.1 % (ref 11.5–15.5)
WBC: 6.2 10*3/uL (ref 4.0–10.5)
nRBC: 0 % (ref 0.0–0.2)

## 2021-08-04 SURGERY — EXCISION MASS
Anesthesia: General | Site: Back | Laterality: Right

## 2021-08-04 MED ORDER — ROCURONIUM BROMIDE 10 MG/ML (PF) SYRINGE
PREFILLED_SYRINGE | INTRAVENOUS | Status: AC
  Filled 2021-08-04: qty 10

## 2021-08-04 MED ORDER — GABAPENTIN 300 MG PO CAPS
ORAL_CAPSULE | ORAL | Status: AC
  Administered 2021-08-04: 300 mg via ORAL
  Filled 2021-08-04: qty 1

## 2021-08-04 MED ORDER — ORAL CARE MOUTH RINSE: 15.0000 mL | Freq: Once | OROMUCOSAL | Status: AC

## 2021-08-04 MED ORDER — CHLORHEXIDINE GLUCONATE 4 % EX LIQD: 60.0000 mL | Freq: Once | CUTANEOUS | Status: DC

## 2021-08-04 MED ORDER — MIDAZOLAM HCL 2 MG/2ML IJ SOLN
INTRAMUSCULAR | Status: AC
  Filled 2021-08-04: qty 2

## 2021-08-04 MED ORDER — ACETAMINOPHEN 500 MG PO TABS
1000.0000 mg | ORAL_TABLET | ORAL | Status: AC
  Administered 2021-08-04: 1000 mg via ORAL

## 2021-08-04 MED ORDER — BUPIVACAINE-EPINEPHRINE (PF) 0.25% -1:200000 IJ SOLN
INTRAMUSCULAR | Status: AC
  Filled 2021-08-04: qty 30

## 2021-08-04 MED ORDER — ONDANSETRON HCL 4 MG/2ML IJ SOLN
INTRAMUSCULAR | Status: AC
  Filled 2021-08-04: qty 2

## 2021-08-04 MED ORDER — SODIUM CHLORIDE 0.9 % IV SOLN: 250.0000 mL | INTRAVENOUS | Status: DC | PRN

## 2021-08-04 MED ORDER — TRAMADOL HCL 50 MG PO TABS: 50.0000 mg | ORAL_TABLET | Freq: Four times a day (QID) | ORAL | 0 refills | Status: AC | PRN

## 2021-08-04 MED ORDER — MIDAZOLAM HCL 2 MG/2ML IJ SOLN
INTRAMUSCULAR | Status: DC | PRN
  Administered 2021-08-04: 2 mg via INTRAVENOUS

## 2021-08-04 MED ORDER — ACETAMINOPHEN 650 MG RE SUPP: 650.0000 mg | RECTAL | Status: DC | PRN

## 2021-08-04 MED ORDER — LIDOCAINE 2% (20 MG/ML) 5 ML SYRINGE
INTRAMUSCULAR | Status: DC | PRN
  Administered 2021-08-04: 60 mg via INTRAVENOUS

## 2021-08-04 MED ORDER — DEXAMETHASONE SODIUM PHOSPHATE 10 MG/ML IJ SOLN
INTRAMUSCULAR | Status: DC | PRN
  Administered 2021-08-04: 8 mg via INTRAVENOUS

## 2021-08-04 MED ORDER — CEFAZOLIN SODIUM-DEXTROSE 2-4 GM/100ML-% IV SOLN
INTRAVENOUS | Status: AC
  Filled 2021-08-04: qty 100

## 2021-08-04 MED ORDER — FENTANYL CITRATE (PF) 100 MCG/2ML IJ SOLN
INTRAMUSCULAR | Status: AC
  Filled 2021-08-04: qty 2

## 2021-08-04 MED ORDER — FENTANYL CITRATE PF 50 MCG/ML IJ SOSY: 25.0000 ug | PREFILLED_SYRINGE | INTRAMUSCULAR | Status: DC | PRN

## 2021-08-04 MED ORDER — CHLORHEXIDINE GLUCONATE 0.12 % MT SOLN
15.0000 mL | Freq: Once | OROMUCOSAL | Status: AC
  Administered 2021-08-04: 15 mL via OROMUCOSAL

## 2021-08-04 MED ORDER — BUPIVACAINE LIPOSOME 1.3 % IJ SUSP: 20.0000 mL | Freq: Once | INTRAMUSCULAR | Status: DC

## 2021-08-04 MED ORDER — SODIUM CHLORIDE 0.9% FLUSH: 3.0000 mL | INTRAVENOUS | Status: DC | PRN

## 2021-08-04 MED ORDER — ACETAMINOPHEN 325 MG PO TABS: 650.0000 mg | ORAL_TABLET | ORAL | Status: DC | PRN

## 2021-08-04 MED ORDER — ROCURONIUM BROMIDE 10 MG/ML (PF) SYRINGE
PREFILLED_SYRINGE | INTRAVENOUS | Status: DC | PRN
  Administered 2021-08-04: 70 mg via INTRAVENOUS

## 2021-08-04 MED ORDER — PROPOFOL 10 MG/ML IV BOLUS
INTRAVENOUS | Status: AC
  Filled 2021-08-04: qty 20

## 2021-08-04 MED ORDER — BUPIVACAINE-EPINEPHRINE 0.25% -1:200000 IJ SOLN
INTRAMUSCULAR | Status: DC | PRN
  Administered 2021-08-04: 14 mL

## 2021-08-04 MED ORDER — DEXAMETHASONE SODIUM PHOSPHATE 10 MG/ML IJ SOLN
INTRAMUSCULAR | Status: AC
  Filled 2021-08-04: qty 1

## 2021-08-04 MED ORDER — CEFAZOLIN SODIUM-DEXTROSE 2-4 GM/100ML-% IV SOLN
2.0000 g | INTRAVENOUS | Status: AC
  Administered 2021-08-04: 2 g via INTRAVENOUS

## 2021-08-04 MED ORDER — LIDOCAINE HCL (PF) 2 % IJ SOLN
INTRAMUSCULAR | Status: AC
  Filled 2021-08-04: qty 5

## 2021-08-04 MED ORDER — SUGAMMADEX SODIUM 200 MG/2ML IV SOLN
INTRAVENOUS | Status: DC | PRN
  Administered 2021-08-04: 280 mg via INTRAVENOUS

## 2021-08-04 MED ORDER — GABAPENTIN 300 MG PO CAPS: 300.0000 mg | ORAL_CAPSULE | ORAL | Status: AC

## 2021-08-04 MED ORDER — ACETAMINOPHEN 500 MG PO TABS
1000.0000 mg | ORAL_TABLET | Freq: Once | ORAL | Status: DC
  Filled 2021-08-04: qty 2

## 2021-08-04 MED ORDER — PROPOFOL 10 MG/ML IV BOLUS
INTRAVENOUS | Status: DC | PRN
  Administered 2021-08-04: 100 mg via INTRAVENOUS
  Administered 2021-08-04: 30 mg via INTRAVENOUS

## 2021-08-04 MED ORDER — FENTANYL CITRATE (PF) 100 MCG/2ML IJ SOLN
INTRAMUSCULAR | Status: DC | PRN
  Administered 2021-08-04: 50 ug via INTRAVENOUS

## 2021-08-04 MED ORDER — 0.9 % SODIUM CHLORIDE (POUR BTL) OPTIME
TOPICAL | Status: DC | PRN
  Administered 2021-08-04: 1000 mL

## 2021-08-04 MED ORDER — ONDANSETRON HCL 4 MG/2ML IJ SOLN
INTRAMUSCULAR | Status: DC | PRN
  Administered 2021-08-04: 4 mg via INTRAVENOUS

## 2021-08-04 MED ORDER — OXYCODONE HCL 5 MG PO TABS: 5.0000 mg | ORAL_TABLET | ORAL | Status: DC | PRN

## 2021-08-04 MED ORDER — LACTATED RINGERS IV SOLN: INTRAVENOUS | Status: DC

## 2021-08-04 MED ORDER — SODIUM CHLORIDE 0.9% FLUSH: 3.0000 mL | Freq: Two times a day (BID) | INTRAVENOUS | Status: DC

## 2021-08-04 SURGICAL SUPPLY — 29 items
BAG COUNTER SPONGE SURGICOUNT (BAG) IMPLANT
COVER SURGICAL LIGHT HANDLE (MISCELLANEOUS) ×2 IMPLANT
DERMABOND ADVANCED (GAUZE/BANDAGES/DRESSINGS)
DERMABOND ADVANCED .7 DNX12 (GAUZE/BANDAGES/DRESSINGS) IMPLANT
DRAPE LAPAROSCOPIC ABDOMINAL (DRAPES) IMPLANT
DRAPE LAPAROTOMY T 102X78X121 (DRAPES) IMPLANT
DRAPE LAPAROTOMY TRNSV 102X78 (DRAPES) IMPLANT
DRAPE UTILITY XL STRL (DRAPES) ×2 IMPLANT
ELECT REM PT RETURN 15FT ADLT (MISCELLANEOUS) ×2 IMPLANT
GAUZE SPONGE 4X4 12PLY STRL (GAUZE/BANDAGES/DRESSINGS) ×2 IMPLANT
GLOVE BIO SURGEON STRL SZ 6 (GLOVE) ×2 IMPLANT
GLOVE INDICATOR 6.5 STRL GRN (GLOVE) ×2 IMPLANT
GOWN STRL REUS W/ TWL LRG LVL3 (GOWN DISPOSABLE) ×1 IMPLANT
GOWN STRL REUS W/ TWL XL LVL3 (GOWN DISPOSABLE) IMPLANT
GOWN STRL REUS W/TWL LRG LVL3 (GOWN DISPOSABLE) ×2
GOWN STRL REUS W/TWL XL LVL3 (GOWN DISPOSABLE)
KIT BASIN OR (CUSTOM PROCEDURE TRAY) ×2 IMPLANT
KIT TURNOVER KIT A (KITS) IMPLANT
MARKER SKIN DUAL TIP RULER LAB (MISCELLANEOUS) IMPLANT
NEEDLE HYPO 22GX1.5 SAFETY (NEEDLE) ×2 IMPLANT
PACK GENERAL/GYN (CUSTOM PROCEDURE TRAY) ×2 IMPLANT
SPIKE FLUID TRANSFER (MISCELLANEOUS) IMPLANT
STAPLER VISISTAT 35W (STAPLE) IMPLANT
SUT MNCRL AB 4-0 PS2 18 (SUTURE) ×2 IMPLANT
SUT VIC AB 3-0 SH 27 (SUTURE) ×2
SUT VIC AB 3-0 SH 27XBRD (SUTURE) ×1 IMPLANT
SYR CONTROL 10ML LL (SYRINGE) ×2 IMPLANT
TOWEL OR 17X26 10 PK STRL BLUE (TOWEL DISPOSABLE) ×2 IMPLANT
TOWEL OR NON WOVEN STRL DISP B (DISPOSABLE) ×2 IMPLANT

## 2021-08-04 NOTE — Interval H&P Note (Signed)
History and Physical Interval Note:  08/04/2021 9:20 AM  Brittany Massey  has presented today for surgery, with the diagnosis of subcutaneous mass.  The various methods of treatment have been discussed with the patient and family. After consideration of risks, benefits and other options for treatment, the patient has consented to  Procedure(s): EXCISION of SUBCUTANEOUS MASS RIGHT UPPER BACK (Right) as a surgical intervention.  The patient's history has been reviewed, patient examined, no change in status, stable for surgery.  I have reviewed the patient's chart and labs.  Questions were answered to the patient's satisfaction.     Madden Garron Rich Brave

## 2021-08-04 NOTE — Anesthesia Procedure Notes (Signed)
Procedure Name: Intubation Date/Time: 08/04/2021 10:23 AM  Performed by: Sharlette Dense, CRNAPre-anesthesia Checklist: Patient identified, Emergency Drugs available, Suction available and Patient being monitored Patient Re-evaluated:Patient Re-evaluated prior to induction Oxygen Delivery Method: Circle system utilized Preoxygenation: Pre-oxygenation with 100% oxygen Induction Type: IV induction Ventilation: Mask ventilation without difficulty Laryngoscope Size: Miller and 2 Grade View: Grade I Tube type: Oral Tube size: 7.0 mm Number of attempts: 1 Airway Equipment and Method: Stylet Placement Confirmation: ETT inserted through vocal cords under direct vision, positive ETCO2 and breath sounds checked- equal and bilateral Secured at: 21 cm Tube secured with: Tape Dental Injury: Teeth and Oropharynx as per pre-operative assessment

## 2021-08-04 NOTE — Anesthesia Preprocedure Evaluation (Addendum)
Anesthesia Evaluation  Patient identified by MRN, date of birth, ID band Patient awake    Reviewed: Allergy & Precautions, NPO status , Patient's Chart, lab work & pertinent test results  Airway Mallampati: II  TM Distance: >3 FB Neck ROM: Full    Dental no notable dental hx. (+) Teeth Intact, Dental Advisory Given   Pulmonary neg pulmonary ROS, former smoker,    Pulmonary exam normal breath sounds clear to auscultation       Cardiovascular negative cardio ROS Normal cardiovascular exam Rhythm:Regular Rate:Normal     Neuro/Psych negative neurological ROS  negative psych ROS   GI/Hepatic negative GI ROS, Neg liver ROS,   Endo/Other  negative endocrine ROS  Renal/GU negative Renal ROS  negative genitourinary   Musculoskeletal  (+) Arthritis ,   Abdominal   Peds  Hematology negative hematology ROS (+)   Anesthesia Other Findings   Reproductive/Obstetrics                            Anesthesia Physical Anesthesia Plan  ASA: 2  Anesthesia Plan: General   Post-op Pain Management:    Induction: Intravenous  PONV Risk Score and Plan: 3 and Midazolam, Dexamethasone and Ondansetron  Airway Management Planned: Oral ETT  Additional Equipment:   Intra-op Plan:   Post-operative Plan: Extubation in OR  Informed Consent: I have reviewed the patients History and Physical, chart, labs and discussed the procedure including the risks, benefits and alternatives for the proposed anesthesia with the patient or authorized representative who has indicated his/her understanding and acceptance.     Dental advisory given  Plan Discussed with: CRNA  Anesthesia Plan Comments:         Anesthesia Quick Evaluation

## 2021-08-04 NOTE — Anesthesia Postprocedure Evaluation (Signed)
Anesthesia Post Note  Patient: Brittany Massey  Procedure(s) Performed: EXCISION of SUBCUTANEOUS MASS RIGHT UPPER BACK (Right: Back)     Patient location during evaluation: PACU Anesthesia Type: General Level of consciousness: awake and alert Pain management: pain level controlled Vital Signs Assessment: post-procedure vital signs reviewed and stable Respiratory status: spontaneous breathing, nonlabored ventilation, respiratory function stable and patient connected to nasal cannula oxygen Cardiovascular status: blood pressure returned to baseline and stable Postop Assessment: no apparent nausea or vomiting Anesthetic complications: no   No notable events documented.  Last Vitals:  Vitals:   08/04/21 1145 08/04/21 1200  BP: 107/81 130/78  Pulse: 68 61  Resp: 16   Temp:  36.7 C  SpO2: 98% 99%    Last Pain:  Vitals:   08/04/21 0946  TempSrc:   PainSc: 0-No pain                 Grayton Lobo L Maryann Mccall

## 2021-08-04 NOTE — Transfer of Care (Signed)
Immediate Anesthesia Transfer of Care Note  Patient: Brittany Massey  Procedure(s) Performed: EXCISION of SUBCUTANEOUS MASS RIGHT UPPER BACK (Right: Back)  Patient Location: PACU  Anesthesia Type:General  Level of Consciousness: awake, alert  and oriented  Airway & Oxygen Therapy: Patient Spontanous Breathing and Patient connected to face mask oxygen  Post-op Assessment: Report given to RN and Post -op Vital signs reviewed and stable  Post vital signs: Reviewed and stable  Last Vitals:  Vitals Value Taken Time  BP    Temp    Pulse 82 08/04/21 1111  Resp 14 08/04/21 1111  SpO2 100 % 08/04/21 1111  Vitals shown include unvalidated device data.  Last Pain:  Vitals:   08/04/21 0946  TempSrc:   PainSc: 0-No pain      Patients Stated Pain Goal: 3 (58/85/02 7741)  Complications: No notable events documented.

## 2021-08-04 NOTE — Op Note (Signed)
Operative Note  SHATAYA WINKLES  [id number]  [id number]  08/04/2021   Surgeon: Romana Juniper MD FACS   Procedure performed: Excision of subcutaneous mass, right upper back, 3 x 2 x 2 cm   Preop diagnosis: Subcutaneous mass Post-op diagnosis/intraop findings: Same, extending to the level of muscle   Specimens: mass  Retained items: no  EBL: 0cc Complications: none   Description of procedure: After obtaining informed consent the patient was taken to the operating room where general endotracheal anesthesia was initiated, preoperative antibiotics were administered, SCDs applied, and a formal timeout was performed.  The patient was then repositioned prone with all pressure points appropriately padded.  The right upper back was prepped and draped in usual sterile fashion.  After infiltration with local, an incision was made along the Langer's lines and the skin overlying the mass.  Soft tissues were dissected with cautery until the surface of the mass was encountered.  Combination of blunt and cautery dissection were used to free it of its attachments to the surrounding soft tissue, the mass did extend into the underlying muscle superficially.  The lesion was excised intact and handed off for pathology.  Hemostasis was ensured in the wound with cautery.  The deeper tissues were closed with 3-0 Vicryl.  The incision was closed with interrupted deep dermal 3-0 Vicryl followed by running subcuticular 4 Monocryl.  Benzoin, Steri-Strips and light pressure dressing were then applied. The patient was then awakened, extubated and taken to PACU in stable condition.    All counts were correct at the completion of the case.

## 2021-08-04 NOTE — Discharge Instructions (Signed)
GENERAL SURGERY: POST OP INSTRUCTIONS  EAT Gradually transition to a high fiber diet with a fiber supplement over the next few weeks after discharge.  Start with a pureed / full liquid diet (see below)  WALK Walk an hour a day (cumulative, not all at once).  Control your pain to do that.    CONTROL PAIN Control pain so that you can walk, sleep, tolerate sneezing/coughing, go up/down stairs.  HAVE A BOWEL MOVEMENT DAILY Keep your bowels regular to avoid problems.  OK to try a laxative to override constipation.  OK to use an antidairrheal to slow down diarrhea.  Call if not better after 2 tries  CALL IF YOU HAVE PROBLEMS/CONCERNS Call if you are still struggling despite following these instructions. Call if you have concerns not answered by these instructions    DIET: Follow a light bland diet & liquids the first 24 hours after arrival home, such as soup, liquids, starches, etc.  Be sure to drink plenty of fluids.  Quickly advance to a usual solid diet within a few days.  Avoid fast food or heavy meals as your are more likely to get nauseated or have irregular bowels.  A low-sugar, high-fiber diet for the rest of your life is ideal.   Take your usually prescribed home medications unless otherwise directed. PAIN CONTROL: Pain is best controlled by a usual combination of three different methods TOGETHER: Ice/Heat Over the counter pain medication Prescription pain medication Most patients will experience some swelling and bruising around the incisions.  Ice packs or heating pads (30-60 minutes up to 6 times a day) will help. Use ice for the first few days to help decrease swelling and bruising, then switch to heat to help relax tight/sore spots and speed recovery.  Some people prefer to use ice alone, heat alone, alternating between ice & heat.  Experiment to what works for you.  Swelling and bruising can take several weeks to resolve.   It is helpful to take an over-the-counter pain  medication regularly for the first few weeks.  Choose one of the following that works best for you: Naproxen (Aleve, etc)  Two 220mg  tabs twice a day OR Ibuprofen (Advil, etc) Three 200mg  tabs four times a day (every meal & bedtime) AND Acetaminophen (Tylenol, etc) 500-650mg  four times a day (every meal & bedtime) A  prescription for pain medication (such as oxycodone, hydrocodone, etc) should be given to you upon discharge.  Take your pain medication as prescribed.  If you are having problems/concerns with the prescription medicine (does not control pain, nausea, vomiting, rash, itching, etc), please call us [phone] to see if we need to switch you to a different pain medicine that will work better for you and/or control your side effect better. If you need a refill on your pain medication, please contact your pharmacy.  They will contact our office to request authorization. Prescriptions will not be filled after 5 pm or on week-ends. Avoid getting constipated.  Between the surgery and the pain medications, it is common to experience some constipation.  Increasing fluid intake and taking a fiber supplement (such as Metamucil, Citrucel, FiberCon, MiraLax, etc) 1-2 times a day regularly will usually help prevent this problem from occurring.  A mild laxative (prune juice, Milk of Magnesia, MiraLax, etc) should be taken according to package directions if there are no bowel movements after 48 hours.   Wash / shower every day, starting 2 days after surgery.  You may shower over the  steri strips as they are waterproof.  Continue to shower over incision(s) after the dressing is off. Remove your outer bandage 2 days after surgery; steri strips will peel off after 1-2 weeks.  You may leave the incision open to air.  You may have skin tapes (Steri Strips) covering the incision(s).  Leave them on until one week, then remove.  You may replace a dressing/Band-Aid to cover the incision for comfort if you wish.       ACTIVITIES as tolerated:   You may resume regular (light) daily activities beginning the next day--such as daily self-care, walking, climbing stairs--gradually increasing activities as tolerated.  If you can walk 30 minutes without difficulty, it is safe to try more intense activity such as jogging, treadmill, bicycling, low-impact aerobics, swimming, etc. Save the most intensive and strenuous activity for last such as sit-ups, heavy lifting, contact sports, etc  Refrain from any heavy lifting or straining until you are off narcotics for pain control.   DO NOT PUSH THROUGH PAIN.  Let pain be your guide: If it hurts to do something, don't do it.  Pain is your body warning you to avoid that activity for another week until the pain goes down. You may drive when you are no longer taking prescription pain medication, you can comfortably wear a seatbelt, and you can safely maneuver your car and apply brakes. You may have sexual intercourse when it is comfortable.  FOLLOW UP in our office Please call [facility] at [phone] to set up an appointment to see your surgeon in the office for a follow-up appointment approximately 2-3 weeks after your surgery. Make sure that you call for this appointment the day you arrive home to insure a convenient appointment time. 9. IF YOU HAVE DISABILITY OR FAMILY LEAVE FORMS, BRING THEM TO THE OFFICE FOR PROCESSING.  DO NOT GIVE THEM TO YOUR DOCTOR.   WHEN TO CALL us [phone]: Poor pain control Reactions / problems with new medications (rash/itching, nausea, etc)  Fever over 101.5 F (38.5 C) Worsening swelling or bruising Continued bleeding from incision. Increased pain, redness, or drainage from the incision Difficulty breathing / swallowing   The clinic staff is available to answer your questions during regular business hours (8:30am-5pm).  Please don't hesitate to call and ask to speak to one of our nurses for clinical concerns.   If you have a medical  emergency, go to the nearest emergency room or call 911.  A surgeon from [facility] is always on call at the [facility], [place], [place], [place], [place]  [address] ? MAIN: [phone] ? TOLL FREE: [phone] ?  FAX [phone] [email or web address]

## 2021-08-05 ENCOUNTER — Encounter (HOSPITAL_COMMUNITY): Payer: Self-pay | Admitting: Surgery

## 2021-08-07 LAB — SURGICAL PATHOLOGY

## 2021-08-08 DIAGNOSIS — D225 Melanocytic nevi of trunk: Secondary | ICD-10-CM | POA: Diagnosis not present

## 2021-08-08 DIAGNOSIS — Z85820 Personal history of malignant melanoma of skin: Secondary | ICD-10-CM | POA: Diagnosis not present

## 2021-08-08 DIAGNOSIS — Z08 Encounter for follow-up examination after completed treatment for malignant neoplasm: Secondary | ICD-10-CM | POA: Diagnosis not present

## 2021-08-08 DIAGNOSIS — Z1283 Encounter for screening for malignant neoplasm of skin: Secondary | ICD-10-CM | POA: Diagnosis not present

## 2022-10-19 DIAGNOSIS — Z85820 Personal history of malignant melanoma of skin: Secondary | ICD-10-CM | POA: Diagnosis not present

## 2022-10-19 DIAGNOSIS — Z08 Encounter for follow-up examination after completed treatment for malignant neoplasm: Secondary | ICD-10-CM | POA: Diagnosis not present

## 2022-10-19 DIAGNOSIS — Z1283 Encounter for screening for malignant neoplasm of skin: Secondary | ICD-10-CM | POA: Diagnosis not present

## 2022-10-19 DIAGNOSIS — D2271 Melanocytic nevi of right lower limb, including hip: Secondary | ICD-10-CM | POA: Diagnosis not present

## 2022-10-19 DIAGNOSIS — D485 Neoplasm of uncertain behavior of skin: Secondary | ICD-10-CM | POA: Diagnosis not present

## 2022-10-19 DIAGNOSIS — D225 Melanocytic nevi of trunk: Secondary | ICD-10-CM | POA: Diagnosis not present

## 2022-10-29 DIAGNOSIS — M10041 Idiopathic gout, right hand: Secondary | ICD-10-CM | POA: Diagnosis not present

## 2022-12-17 DIAGNOSIS — Z1231 Encounter for screening mammogram for malignant neoplasm of breast: Secondary | ICD-10-CM | POA: Diagnosis not present

## 2023-01-18 DIAGNOSIS — Z86006 Personal history of melanoma in-situ: Secondary | ICD-10-CM | POA: Diagnosis not present

## 2023-01-18 DIAGNOSIS — Z08 Encounter for follow-up examination after completed treatment for malignant neoplasm: Secondary | ICD-10-CM | POA: Diagnosis not present

## 2023-01-18 DIAGNOSIS — D225 Melanocytic nevi of trunk: Secondary | ICD-10-CM | POA: Diagnosis not present

## 2023-01-18 DIAGNOSIS — Z1283 Encounter for screening for malignant neoplasm of skin: Secondary | ICD-10-CM | POA: Diagnosis not present

## 2023-01-21 DIAGNOSIS — Z8632 Personal history of gestational diabetes: Secondary | ICD-10-CM | POA: Diagnosis not present

## 2023-01-21 DIAGNOSIS — Z0000 Encounter for general adult medical examination without abnormal findings: Secondary | ICD-10-CM | POA: Diagnosis not present

## 2023-01-21 DIAGNOSIS — Z13220 Encounter for screening for lipoid disorders: Secondary | ICD-10-CM | POA: Diagnosis not present

## 2023-03-12 DIAGNOSIS — E6689 Other obesity not elsewhere classified: Secondary | ICD-10-CM | POA: Diagnosis not present

## 2023-03-12 DIAGNOSIS — Z6830 Body mass index (BMI) 30.0-30.9, adult: Secondary | ICD-10-CM | POA: Diagnosis not present

## 2023-03-20 DIAGNOSIS — D123 Benign neoplasm of transverse colon: Secondary | ICD-10-CM | POA: Diagnosis not present

## 2023-03-20 DIAGNOSIS — D122 Benign neoplasm of ascending colon: Secondary | ICD-10-CM | POA: Diagnosis not present

## 2023-03-20 DIAGNOSIS — Z1211 Encounter for screening for malignant neoplasm of colon: Secondary | ICD-10-CM | POA: Diagnosis not present

## 2023-04-10 DIAGNOSIS — E6689 Other obesity not elsewhere classified: Secondary | ICD-10-CM | POA: Diagnosis not present

## 2023-04-10 DIAGNOSIS — Z6830 Body mass index (BMI) 30.0-30.9, adult: Secondary | ICD-10-CM | POA: Diagnosis not present

## 2023-04-12 DIAGNOSIS — D225 Melanocytic nevi of trunk: Secondary | ICD-10-CM | POA: Diagnosis not present

## 2023-04-12 DIAGNOSIS — D485 Neoplasm of uncertain behavior of skin: Secondary | ICD-10-CM | POA: Diagnosis not present

## 2023-04-12 DIAGNOSIS — Z1283 Encounter for screening for malignant neoplasm of skin: Secondary | ICD-10-CM | POA: Diagnosis not present

## 2023-04-12 DIAGNOSIS — Z08 Encounter for follow-up examination after completed treatment for malignant neoplasm: Secondary | ICD-10-CM | POA: Diagnosis not present

## 2023-04-12 DIAGNOSIS — Z85820 Personal history of malignant melanoma of skin: Secondary | ICD-10-CM | POA: Diagnosis not present

## 2023-04-12 DIAGNOSIS — D2272 Melanocytic nevi of left lower limb, including hip: Secondary | ICD-10-CM | POA: Diagnosis not present
